# Patient Record
Sex: Male | Born: 1970 | Race: White | Hispanic: No | Marital: Single | Smoking: Never smoker
Health system: Southern US, Community
[De-identification: ages and names within clinical notes are randomized; demographics above are authoritative.]

## PROBLEM LIST (undated history)

## (undated) DIAGNOSIS — E46 Unspecified protein-calorie malnutrition: Secondary | ICD-10-CM

## (undated) DIAGNOSIS — K219 Gastro-esophageal reflux disease without esophagitis: Secondary | ICD-10-CM

## (undated) DIAGNOSIS — G809 Cerebral palsy, unspecified: Secondary | ICD-10-CM

## (undated) DIAGNOSIS — K5900 Constipation, unspecified: Secondary | ICD-10-CM

## (undated) DIAGNOSIS — I219 Acute myocardial infarction, unspecified: Secondary | ICD-10-CM

## (undated) DIAGNOSIS — M419 Scoliosis, unspecified: Secondary | ICD-10-CM

## (undated) HISTORY — PX: ABDOMINAL SURGERY: SHX537

---

## 2015-08-04 ENCOUNTER — Encounter (HOSPITAL_COMMUNITY): Payer: Self-pay | Admitting: Emergency Medicine

## 2015-08-04 ENCOUNTER — Emergency Department (INDEPENDENT_AMBULATORY_CARE_PROVIDER_SITE_OTHER)
Admission: EM | Admit: 2015-08-04 | Discharge: 2015-08-04 | Disposition: A | Discharge status: Home / Self Care | Payer: Medicare Other | Source: Home / Self Care | Attending: Family Medicine | Admitting: Family Medicine

## 2015-08-04 DIAGNOSIS — G809 Cerebral palsy, unspecified: Secondary | ICD-10-CM | POA: Diagnosis not present

## 2015-08-04 DIAGNOSIS — J069 Acute upper respiratory infection, unspecified: Secondary | ICD-10-CM

## 2015-08-04 HISTORY — DX: Scoliosis, unspecified: M41.9

## 2015-08-04 HISTORY — DX: Cerebral palsy, unspecified: G80.9

## 2015-08-04 HISTORY — DX: Unspecified protein-calorie malnutrition: E46

## 2015-08-04 HISTORY — DX: Constipation, unspecified: K59.00

## 2015-08-04 HISTORY — DX: Acute myocardial infarction, unspecified: I21.9

## 2015-08-04 HISTORY — DX: Gastro-esophageal reflux disease without esophagitis: K21.9

## 2015-08-04 MED ORDER — AZITHROMYCIN 250 MG PO TABS: 250.0000 mg | ORAL_TABLET | Freq: Every day | ORAL | Status: DC

## 2015-08-04 MED ORDER — IPRATROPIUM BROMIDE 0.06 % NA SOLN: 2.0000 | Freq: Four times a day (QID) | NASAL | Status: DC

## 2015-08-04 MED ORDER — BENZONATATE 100 MG PO CAPS: 100.0000 mg | ORAL_CAPSULE | Freq: Three times a day (TID) | ORAL | Status: DC | PRN

## 2015-08-04 NOTE — ED Notes (Signed)
C/o congestion States he has been coughing Present with guardian since May 2016 Does use neb as tx

## 2015-08-04 NOTE — Discharge Instructions (Signed)
Please start the nasal saline 2-4 times per day to clean his nasal passages Please use tessalon for cough relief Please use the nasal atrovent to dry up nasal secretions Please give him 1200mg  of guaifenesin every 12 hours to break up the mucus Please start the Azithro if his symptoms get worse.

## 2015-08-04 NOTE — ED Provider Notes (Signed)
CSN: [id number]     Arrival date & time 08/04/15  1624 History   First MD Initiated Contact with Patient 08/04/15 1645     Chief Complaint  Patient presents with  . Nasal Congestion   (Consider location/radiation/quality/duration/timing/severity/associated sxs/prior Treatment) HPI  Patient with cerebral palsy presenting with his guardian. Cough 1 day. Wet sounding. Guardian states that patient is oftentimes febrile at baseline and is on Tylenol for chronic pain management so unsure if it patient has had any breakthrough fevers. Symptoms are worse at night. He states that patient is an aspiration risk but has not appreciated any recent aspiration. Patient took a nap yesterday which is extremely unusual for him. Patient is typically will chair bound. Patient currently has a sacral decubitus ulcer for which she receives wound care management and currently is undergoing regular bandages changes with Silvadene cream. Denies any increased work of breathing, complaints of chest pain, somnolence, loss of appetite.      Past Medical History  Diagnosis Date  . Constipation   . Cerebral palsy (HCC)   . Scoliosis   . Protein calorie malnutrition (HCC)   . GERD (gastroesophageal reflux disease)   . MI (myocardial infarction) (HCC)    No past surgical history on file. Family History  Problem Relation Age of Onset  . Heart attack Mother   . Heart attack Father    Social History  Substance Use Topics  . Smoking status: None  . Smokeless tobacco: None  . Alcohol Use: None    Review of Systems Per HPI with all other pertinent systems negative.   Allergies  Review of patient's allergies indicates no known allergies.  Home Medications   Prior to Admission medications   Medication Sig Start Date End Date Taking? Authorizing Provider  acetaminophen (TYLENOL) 500 MG tablet Take 500 mg by mouth every 6 (six) hours as needed.   Yes Historical Provider, MD  albuterol (PROVENTIL) (2.5 MG/3ML)  0.083% nebulizer solution Take 2.5 mg by nebulization every 6 (six) hours as needed for wheezing or shortness of breath.   Yes Historical Provider, MD  bisacodyl (DULCOLAX) 10 MG suppository Place 10 mg rectally as needed for moderate constipation.   Yes Historical Provider, MD  ferrous sulfate 325 (65 FE) MG tablet Take 325 mg by mouth daily with breakfast.   Yes Historical Provider, MD  FLUoxetine (PROZAC) 40 MG capsule Take 40 mg by mouth daily.   Yes Historical Provider, MD  guaiFENesin (ROBITUSSIN) 100 MG/5ML liquid Take 200 mg by mouth 3 (three) times daily as needed for cough.   Yes Historical Provider, MD  lactulose, encephalopathy, (CHRONULAC) 10 GM/15ML SOLN Take by mouth.   Yes Historical Provider, MD  LORazepam (ATIVAN) 0.5 MG tablet Take 0.5 mg by mouth every 8 (eight) hours.   Yes Historical Provider, MD  magnesium oxide (MAG-OX) 400 MG tablet Take 400 mg by mouth 2 (two) times daily.   Yes Historical Provider, MD  Polyethylene Glycol 3350 GRAN by Does not apply route.   Yes Historical Provider, MD  potassium chloride (K-DUR,KLOR-CON) 10 MEQ tablet Take 10 mEq by mouth 2 (two) times daily.   Yes Historical Provider, MD  traZODone (DESYREL) 50 MG tablet Take 50 mg by mouth at bedtime.   Yes Historical Provider, MD  zinc oxide 20 % ointment Apply 1 application topically as needed for irritation.   Yes Historical Provider, MD  azithromycin (ZITHROMAX) 250 MG tablet Take 1 tablet (250 mg total) by mouth daily. Take first 2 tablets  together, then 1 every day until finished. 08/04/15   Ozella Rocksavid J Merrell, MD  benzonatate (TESSALON PERLES) 100 MG capsule Take 1-2 capsules (100-200 mg total) by mouth 3 (three) times daily as needed for cough. 08/04/15   Ozella Rocksavid J Merrell, MD  ipratropium (ATROVENT) 0.06 % nasal spray Place 2 sprays into both nostrils 4 (four) times daily. 08/04/15   Ozella Rocksavid J Merrell, MD   Meds Ordered and Administered this Visit  Medications - No data to display  BP 139/77 mmHg   Pulse 90  Temp(Src) 98.4 F (36.9 C) (Oral)  Resp 22  SpO2 96% No data found.   Physical Exam Physical Exam  Constitutional: oriented to person, place, and time. appears well-developed and well-nourished. No distress.  HENT:  Head: Normocephalic and atraumatic.  Eyes: EOMI. PERRL.  Neck: Normal range of motion.  Cardiovascular: RRR, no m/r/g, 2+ distal pulses,  Pulmonary/Chest: Large airway congestion with no crackles wheezes or rhonchi in the lung bases or apices. Normal work of breathing. Upper airway congestion relieved with coughing..  Abdominal: Soft. Bowel sounds are normal. NonTTP, no distension.  Musculoskeletal: Contracture of upper and lower extremities at baseline.  Skin: Skin is warm. No rash noted. non diaphoretic.  sacral decubitus not appreciated at this time due to difficulty with moving patient.  Psychiatric: Patient is virtually nonverbal but will shake his head in response to questions and follows basic commands.  ED Course  Procedures (including critical care time)  Labs Review Labs Reviewed - No data to display  Imaging Review No results found.   Visual Acuity Review  Right Eye Distance:   Left Eye Distance:   Bilateral Distance:    Right Eye Near:   Left Eye Near:    Bilateral Near:         MDM   1. URI (upper respiratory infection)   2. Cerebral palsy, unspecified type (HCC)    unlikely to currently have pneumonia the patient is at a very high risk for this given his recurrent pneumonias per guardian. Patient also very susceptible to treat patient conservatively with guaifenesin, Tylenol, nasal saline, nasal Atrovent. Patient will be discharged with a prescription for azithromycin to be used if he develops worsening symptoms. Family where this and will follow instructions.   Ozella Rocksavid J Merrell, MD 08/04/15 [id number]

## 2015-08-10 ENCOUNTER — Emergency Department (HOSPITAL_COMMUNITY): Payer: Medicare Other

## 2015-08-10 ENCOUNTER — Emergency Department (HOSPITAL_COMMUNITY)
Admission: EM | Admit: 2015-08-10 | Discharge: 2015-08-10 | Disposition: A | Discharge status: Home / Self Care | Payer: Medicare Other | Attending: Emergency Medicine | Admitting: Emergency Medicine

## 2015-08-10 ENCOUNTER — Encounter (HOSPITAL_COMMUNITY): Payer: Self-pay | Admitting: Emergency Medicine

## 2015-08-10 DIAGNOSIS — R05 Cough: Secondary | ICD-10-CM

## 2015-08-10 DIAGNOSIS — J40 Bronchitis, not specified as acute or chronic: Secondary | ICD-10-CM

## 2015-08-10 DIAGNOSIS — K219 Gastro-esophageal reflux disease without esophagitis: Secondary | ICD-10-CM | POA: Insufficient documentation

## 2015-08-10 DIAGNOSIS — J209 Acute bronchitis, unspecified: Secondary | ICD-10-CM | POA: Diagnosis not present

## 2015-08-10 DIAGNOSIS — R059 Cough, unspecified: Secondary | ICD-10-CM

## 2015-08-10 DIAGNOSIS — I252 Old myocardial infarction: Secondary | ICD-10-CM | POA: Insufficient documentation

## 2015-08-10 DIAGNOSIS — Z79899 Other long term (current) drug therapy: Secondary | ICD-10-CM | POA: Insufficient documentation

## 2015-08-10 LAB — BASIC METABOLIC PANEL
Anion gap: 14 (ref 5–15)
BUN: 7 mg/dL (ref 6–20)
CHLORIDE: 101 mmol/L (ref 101–111)
CO2: 25 mmol/L (ref 22–32)
CREATININE: 0.39 mg/dL — AB (ref 0.61–1.24)
Calcium: 9.9 mg/dL (ref 8.9–10.3)
GFR calc Af Amer: >60 mL/min (ref >60)
GFR calc non Af Amer: >60 mL/min (ref >60)
Glucose, Bld: 83 mg/dL (ref 65–99)
POTASSIUM: 4.7 mmol/L (ref 3.5–5.1)
Sodium: 140 mmol/L (ref 135–145)

## 2015-08-10 LAB — CBC WITH DIFFERENTIAL/PLATELET
Basophils Absolute: 0 10*3/uL (ref 0.0–0.1)
Basophils Relative: 0 %
EOS ABS: 0 10*3/uL (ref 0.0–0.7)
EOS PCT: 1 %
HCT: 41.6 % (ref 39.0–52.0)
HEMOGLOBIN: 13.3 g/dL (ref 13.0–17.0)
LYMPHS ABS: 1.2 10*3/uL (ref 0.7–4.0)
LYMPHS PCT: 15 %
MCH: 28.9 pg (ref 26.0–34.0)
MCHC: 32 g/dL (ref 30.0–36.0)
MCV: 90.4 fL (ref 78.0–100.0)
MONOS PCT: 7 %
Monocytes Absolute: 0.6 10*3/uL (ref 0.1–1.0)
Neutro Abs: 6.4 10*3/uL (ref 1.7–7.7)
Neutrophils Relative %: 77 %
PLATELETS: 280 10*3/uL (ref 150–400)
RBC: 4.6 MIL/uL (ref 4.22–5.81)
RDW: 13.5 % (ref 11.5–15.5)
WBC: 8.2 10*3/uL (ref 4.0–10.5)

## 2015-08-10 LAB — BRAIN NATRIURETIC PEPTIDE: B NATRIURETIC PEPTIDE 5: 66.4 pg/mL (ref 0.0–100.0)

## 2015-08-10 MED ORDER — ALBUTEROL SULFATE (2.5 MG/3ML) 0.083% IN NEBU
5.0000 mg | INHALATION_SOLUTION | Freq: Once | RESPIRATORY_TRACT | Status: AC
Start: 2015-08-10 — End: 2015-08-10
  Administered 2015-08-10: 5 mg via RESPIRATORY_TRACT
  Filled 2015-08-10: qty 6

## 2015-08-10 MED ORDER — BENZONATATE 100 MG PO CAPS: 100.0000 mg | ORAL_CAPSULE | Freq: Three times a day (TID) | ORAL | Status: DC

## 2015-08-10 MED ORDER — IPRATROPIUM BROMIDE 0.02 % IN SOLN
0.5000 mg | Freq: Once | RESPIRATORY_TRACT | Status: AC
  Administered 2015-08-10: 0.5 mg via RESPIRATORY_TRACT
  Filled 2015-08-10: qty 2.5

## 2015-08-10 MED ORDER — DOXYCYCLINE HYCLATE 100 MG PO CAPS: 100.0000 mg | ORAL_CAPSULE | Freq: Two times a day (BID) | ORAL | Status: DC

## 2015-08-10 NOTE — Discharge Instructions (Signed)
You have been seen today for cough and bronchitis. Your imaging and lab tests showed no abnormalities. Recommend close follow up by PCP. Return to ED should symptoms worsen.

## 2015-08-10 NOTE — ED Provider Notes (Signed)
CSN: [id number]     Arrival date & time 08/10/15  1219 History   First MD Initiated Contact with Patient 08/10/15 1431     Chief Complaint  Patient presents with  . Cough     (Consider location/radiation/quality/duration/timing/severity/associated sxs/prior Treatment) HPI   Malik Webb is a 44 y.o. male, pt with history of Cerebral Palsy and MI, presents with non-productive cough that has been going on since 10/19. Pt was given azithromycin and tessalon pearls. Pt has increased shortness of breath, cough, and congestion since then as well as orthopnea. Pt is completely non-verbal. Caregiver Raoul Pitch) denies fever for pt, but adds that he takes tylenol on a scheduled basis. Pt also has a stage three decubitus ulcer on the left side of his sacrum, about the size of a quarter. Pt accompanied today by his primary care giver, the group home supervisor. Pt can answer yes or no questions and says "no" when asked if he is hurting somewhere. Specifically denies chest pain, shortness of breath, or vomiting.     Past Medical History  Diagnosis Date  . Constipation   . Cerebral palsy (HCC)   . Scoliosis   . Protein calorie malnutrition (HCC)   . GERD (gastroesophageal reflux disease)   . MI (myocardial infarction) (HCC)    History reviewed. No pertinent past surgical history. Family History  Problem Relation Age of Onset  . Heart attack Mother   . Heart attack Father    Social History  Substance Use Topics  . Smoking status: None  . Smokeless tobacco: None  . Alcohol Use: None    Review of Systems  Constitutional: Negative for fever, diaphoresis and unexpected weight change.  HENT: Negative for rhinorrhea.   Respiratory: Positive for cough, shortness of breath and wheezing.   Cardiovascular: Positive for chest pain. Negative for palpitations and leg swelling.  Gastrointestinal: Negative for vomiting, abdominal pain, diarrhea, constipation and blood in stool.  Genitourinary: Negative  for dysuria, hematuria and flank pain.  Musculoskeletal: Negative for back pain and joint swelling.  Skin: Positive for wound. Negative for color change, pallor and rash.  Neurological: Negative for dizziness, syncope, weakness and light-headedness.  All other systems reviewed and are negative.     Allergies  Sulfa antibiotics; Cefazolin; Erythromycin; and Ofloxacin  Home Medications   Prior to Admission medications   Medication Sig Start Date End Date Taking? Authorizing Provider  acetaminophen (TYLENOL) 500 MG tablet Take 500 mg by mouth every 6 (six) hours as needed for moderate pain.    Yes Historical Provider, MD  albuterol (PROVENTIL) (2.5 MG/3ML) 0.083% nebulizer solution Take 2.5 mg by nebulization every 6 (six) hours as needed for wheezing or shortness of breath.   Yes Historical Provider, MD  baclofen (LIORESAL) 20 MG tablet Take 20 mg by mouth 3 (three) times daily.   Yes Historical Provider, MD  benzonatate (TESSALON PERLES) 100 MG capsule Take 1-2 capsules (100-200 mg total) by mouth 3 (three) times daily as needed for cough. 08/04/15  Yes Ozella Rocks, MD  ferrous sulfate 325 (65 FE) MG tablet Take 325 mg by mouth daily with breakfast.   Yes Historical Provider, MD  FLUoxetine (PROZAC) 40 MG capsule Take 40 mg by mouth daily.   Yes Historical Provider, MD  gabapentin (NEURONTIN) 300 MG capsule Take 300 mg by mouth 3 (three) times daily.   Yes Historical Provider, MD  lactulose (CHRONULAC) 10 GM/15ML solution Take 10 g by mouth daily as needed for moderate constipation.   Yes  Historical Provider, MD  LORazepam (ATIVAN) 0.5 MG tablet Take 0.5 mg by mouth at bedtime.   Yes Historical Provider, MD  magnesium oxide (MAG-OX) 400 MG tablet Take 800 mg by mouth 2 (two) times daily.    Yes Historical Provider, MD  Phenylephrine-DM-GG (QC TUSSIN CF) 5-10-100 MG/5ML LIQD Take 5 mLs by mouth every 6 (six) hours as needed (for cough).   Yes Historical Provider, MD  potassium chloride  (K-DUR,KLOR-CON) 10 MEQ tablet Take 20 mEq by mouth daily.    Yes Historical Provider, MD  sucralfate (CARAFATE) 1 G tablet Take 1 g by mouth 4 (four) times daily.   Yes Historical Provider, MD  traZODone (DESYREL) 150 MG tablet Take 150 mg by mouth at bedtime.   Yes Historical Provider, MD  traZODone (DESYREL) 50 MG tablet Take 50 mg by mouth daily as needed (for agitation).   Yes Historical Provider, MD  zinc oxide (BALMEX) 11.3 % CREA cream Apply 1 application topically 2 (two) times daily.   Yes Historical Provider, MD  benzonatate (TESSALON) 100 MG capsule Take 1 capsule (100 mg total) by mouth every 8 (eight) hours. 08/10/15   Shawn C Joy, PA-C  doxycycline (VIBRAMYCIN) 100 MG capsule Take 1 capsule (100 mg total) by mouth 2 (two) times daily. 08/10/15   Shawn C Joy, PA-C   BP 145/97 mmHg  Pulse 93  Temp(Src) 98.5 F (36.9 C)  Resp 25  SpO2 97% Physical Exam  Constitutional: He appears well-developed and well-nourished. No distress.  HENT:  Head: Normocephalic and atraumatic.  Eyes: Conjunctivae are normal. Pupils are equal, round, and reactive to light.  Cardiovascular: Normal rate, regular rhythm and normal heart sounds.   Pulmonary/Chest: Effort normal and breath sounds normal. No respiratory distress.  Abdominal: Soft. Bowel sounds are normal.  Musculoskeletal: He exhibits no edema or tenderness.  Neurological: He is alert.  Skin: Skin is warm and dry. He is not diaphoretic.  Nursing note and vitals reviewed.   ED Course  Procedures (including critical care time) Labs Review Labs Reviewed  BASIC METABOLIC PANEL - Abnormal; Notable for the following:    Creatinine, Ser 0.39 (*)    All other components within normal limits  CBC WITH DIFFERENTIAL/PLATELET  BRAIN NATRIURETIC PEPTIDE    Imaging Review Dg Chest 1 View  08/10/2015  CLINICAL DATA:  Cough, congestion in a patient with cerebral palsy. EXAM: CHEST 1 VIEW COMPARISON:  None. FINDINGS: Single frontal radiograph  demonstrates normal cardiomediastinal silhouette. The left lung is clear. There is no evidence of pleural effusion or pneumothorax. There is elevation of the right hemidiaphragm, with atelectatic changes of the right lower lobe. Gaseous distention of the colon is noted. IMPRESSION: No evidence of focal airspace consolidation. Elevation of the right hemidiaphragm with subsequent atelectatic changes in the right lung base. Gaseous distention of the colon. Electronically Signed   By: Ted Mcalpine M.D.   On: 08/10/2015 13:19   I have personally reviewed and evaluated these images and lab results as part of my medical decision-making.   EKG Interpretation   Date/Time:  Wednesday August 10 2015 15:41:57 EDT Ventricular Rate:  86 PR Interval:  167 QRS Duration: 122 QT Interval:  501 QTC Calculation: 599 R Axis:   54 Text Interpretation:  Sinus rhythm Baseline wander in lead(s) I II aVR aVL  V4 V6 No previous tracing Confirmed by Denton Lank  MD, Caryn Bee ([id number]) on  08/10/2015 5:14:32 PM      MDM   Final diagnoses:  Bronchitis  Malik Webb presents with shortness of breath and persistent cough for over 1 week.  Findings and plan of care discussed with Cathren LaineKevin Steinl, MD.  Pt has no history that would suggest a HAP, but could still have CAP, despite initial azithromycin treatment. Differential includes CAP vs viral bronchitis vs URI. Albuterol and labs ordered. Pt may also have a baseline cough. EKG shows sinus rhythm. CXR shows no consolidation or pulmonary edema. BMP, CBC, and BNP without significant abnormalities. Plan to discharge patient with refill of tessalon pearls and doxycycline 100mg  BID for 7 days. Recommend close PCP follow up. Updated caregiver, [place], with discharge plan. Sierra agrees to plan and is comfortable with discharge.     Anselm PancoastShawn C Joy, PA-C 08/10/15 1753  Cathren LaineKevin Steinl, MD 08/10/15 [phone]

## 2015-08-10 NOTE — ED Notes (Signed)
Pt here with caregiver c/o increased cough and congestion with blood tinged sputum; pt has hx of PNA in past

## 2016-01-16 ENCOUNTER — Encounter (HOSPITAL_COMMUNITY): Payer: Self-pay | Admitting: Emergency Medicine

## 2016-01-16 ENCOUNTER — Emergency Department (INDEPENDENT_AMBULATORY_CARE_PROVIDER_SITE_OTHER)
Admission: EM | Admit: 2016-01-16 | Discharge: 2016-01-16 | Disposition: A | Discharge status: Home / Self Care | Payer: Self-pay | Source: Home / Self Care

## 2016-01-16 DIAGNOSIS — M542 Cervicalgia: Secondary | ICD-10-CM

## 2016-01-16 DIAGNOSIS — M5441 Lumbago with sciatica, right side: Secondary | ICD-10-CM
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
## 2016-01-16 NOTE — ED Notes (Signed)
Transfer delayed.  Caregiver will take patient to the emergency department.  [facility] is being seen at [facility]

## 2016-01-28 ENCOUNTER — Emergency Department (HOSPITAL_COMMUNITY)
Admission: EM | Admit: 2016-01-28 | Discharge: 2016-01-28 | Disposition: A | Discharge status: Home / Self Care | Payer: Medicare Other | Attending: Emergency Medicine | Admitting: Emergency Medicine

## 2016-01-28 ENCOUNTER — Encounter (HOSPITAL_COMMUNITY): Payer: Self-pay | Admitting: Emergency Medicine

## 2016-01-28 ENCOUNTER — Emergency Department (HOSPITAL_COMMUNITY): Payer: Medicare Other

## 2016-01-28 DIAGNOSIS — Z79899 Other long term (current) drug therapy: Secondary | ICD-10-CM | POA: Insufficient documentation

## 2016-01-28 DIAGNOSIS — R059 Cough, unspecified: Secondary | ICD-10-CM

## 2016-01-28 DIAGNOSIS — R05 Cough: Secondary | ICD-10-CM | POA: Diagnosis present

## 2016-01-28 DIAGNOSIS — K219 Gastro-esophageal reflux disease without esophagitis: Secondary | ICD-10-CM | POA: Insufficient documentation

## 2016-01-28 DIAGNOSIS — R509 Fever, unspecified: Secondary | ICD-10-CM | POA: Diagnosis not present

## 2016-01-28 DIAGNOSIS — K5900 Constipation, unspecified: Secondary | ICD-10-CM | POA: Insufficient documentation

## 2016-01-28 DIAGNOSIS — I252 Old myocardial infarction: Secondary | ICD-10-CM | POA: Insufficient documentation

## 2016-01-28 DIAGNOSIS — M419 Scoliosis, unspecified: Secondary | ICD-10-CM | POA: Insufficient documentation

## 2016-01-28 DIAGNOSIS — Z8669 Personal history of other diseases of the nervous system and sense organs: Secondary | ICD-10-CM | POA: Insufficient documentation

## 2016-01-28 DIAGNOSIS — R0602 Shortness of breath: Secondary | ICD-10-CM | POA: Diagnosis not present

## 2016-01-28 DIAGNOSIS — Z8639 Personal history of other endocrine, nutritional and metabolic disease: Secondary | ICD-10-CM | POA: Insufficient documentation

## 2016-01-28 LAB — BASIC METABOLIC PANEL
ANION GAP: 10 (ref 5–15)
BUN: 9 mg/dL (ref 6–20)
CHLORIDE: 107 mmol/L (ref 101–111)
CO2: 29 mmol/L (ref 22–32)
Calcium: 9.7 mg/dL (ref 8.9–10.3)
Creatinine, Ser: 0.46 mg/dL — ABNORMAL LOW (ref 0.61–1.24)
GFR calc non Af Amer: >60 mL/min (ref >60)
Glucose, Bld: 101 mg/dL — ABNORMAL HIGH (ref 65–99)
POTASSIUM: 4 mmol/L (ref 3.5–5.1)
SODIUM: 146 mmol/L — AB (ref 135–145)

## 2016-01-28 LAB — CBC WITH DIFFERENTIAL/PLATELET
BASOS PCT: 0 %
Basophils Absolute: 0 10*3/uL (ref 0.0–0.1)
Eosinophils Absolute: 0 10*3/uL (ref 0.0–0.7)
Eosinophils Relative: 0 %
HCT: 42.1 % (ref 39.0–52.0)
Hemoglobin: 13.2 g/dL (ref 13.0–17.0)
Lymphocytes Relative: 10 %
Lymphs Abs: 0.9 10*3/uL (ref 0.7–4.0)
MCH: 29 pg (ref 26.0–34.0)
MCHC: 31.4 g/dL (ref 30.0–36.0)
MCV: 92.5 fL (ref 78.0–100.0)
MONO ABS: 0.5 10*3/uL (ref 0.1–1.0)
MONOS PCT: 5 %
NEUTROS PCT: 85 %
Neutro Abs: 7.8 10*3/uL — ABNORMAL HIGH (ref 1.7–7.7)
Platelets: 251 10*3/uL (ref 150–400)
RBC: 4.55 MIL/uL (ref 4.22–5.81)
RDW: 14 % (ref 11.5–15.5)
WBC: 9.2 10*3/uL (ref 4.0–10.5)

## 2016-01-28 LAB — URINALYSIS, ROUTINE W REFLEX MICROSCOPIC
Bilirubin Urine: NEGATIVE
Glucose, UA: NEGATIVE mg/dL
Hgb urine dipstick: NEGATIVE
Ketones, ur: NEGATIVE mg/dL
LEUKOCYTES UA: NEGATIVE
NITRITE: NEGATIVE
Protein, ur: NEGATIVE mg/dL
SPECIFIC GRAVITY, URINE: 1.02 (ref 1.005–1.030)
pH: >9 — ABNORMAL HIGH (ref 5.0–8.0)

## 2016-01-28 LAB — I-STAT CG4 LACTIC ACID, ED: LACTIC ACID, VENOUS: 1.48 mmol/L (ref 0.5–2.0)

## 2016-01-28 MED ORDER — AMOXICILLIN-POT CLAVULANATE 875-125 MG PO TABS
1.0000 | ORAL_TABLET | Freq: Once | ORAL | Status: AC
  Administered 2016-01-28: 1 via ORAL
  Filled 2016-01-28: qty 1

## 2016-01-28 MED ORDER — ACETAMINOPHEN 650 MG RE SUPP
650.0000 mg | Freq: Once | RECTAL | Status: AC
  Administered 2016-01-28: 650 mg via RECTAL
  Filled 2016-01-28: qty 1

## 2016-01-28 MED ORDER — LEVOFLOXACIN IN D5W 750 MG/150ML IV SOLN
750.0000 mg | Freq: Once | INTRAVENOUS | Status: DC
  Filled 2016-01-28: qty 150

## 2016-01-28 MED ORDER — BENZONATATE 100 MG PO CAPS: 100.0000 mg | ORAL_CAPSULE | Freq: Three times a day (TID) | ORAL | Status: DC

## 2016-01-28 MED ORDER — IPRATROPIUM-ALBUTEROL 0.5-2.5 (3) MG/3ML IN SOLN
3.0000 mL | Freq: Once | RESPIRATORY_TRACT | Status: AC
  Administered 2016-01-28: 3 mL via RESPIRATORY_TRACT
  Filled 2016-01-28: qty 3

## 2016-01-28 MED ORDER — AMOXICILLIN-POT CLAVULANATE 875-125 MG PO TABS: 1.0000 | ORAL_TABLET | Freq: Two times a day (BID) | ORAL | Status: DC

## 2016-01-28 MED ORDER — SODIUM CHLORIDE 0.9 % IV BOLUS (SEPSIS)
1000.0000 mL | Freq: Once | INTRAVENOUS | Status: AC
  Administered 2016-01-28: 1000 mL via INTRAVENOUS

## 2016-01-28 MED ORDER — ALBUTEROL SULFATE (2.5 MG/3ML) 0.083% IN NEBU: 2.5000 mg | INHALATION_SOLUTION | Freq: Four times a day (QID) | RESPIRATORY_TRACT | Status: AC | PRN

## 2016-01-28 NOTE — Discharge Instructions (Signed)
You have been seen today for cough and fever. Your imaging and lab tests showed no abnormalities, however, your symptoms may indicate an early pneumonia. Please take all of your antibiotics until finished!   You may develop abdominal discomfort or diarrhea from the antibiotic.  You may help offset this with probiotics which you can buy or get in yogurt. Do not eat or take the probiotics until 2 hours after your antibiotic. Return to the ED or go to your PCP for follow-up and repeat chest x-ray in 2 days. Return to ED sooner should symptoms worsen. Use albuterol nebulizer every 4-6 hours as needed for wheezing or shortness of breath. Talk to your PCP to have him order a suction system for you.

## 2016-01-28 NOTE — ED Notes (Signed)
Pt arrives from home with caregiver who reports fever beginning this am.  Caregiver reports general malaise since Wednesday, cough since yesterday.  Congested cough noted on arrival, sats 96% RA.

## 2016-01-28 NOTE — ED Provider Notes (Signed)
CSN: [id number]     Arrival date & time 01/28/16  4098 History   First MD Initiated Contact with Patient 01/28/16 0914     Chief Complaint  Patient presents with  . Fever  . Cough     (Consider location/radiation/quality/duration/timing/severity/associated sxs/prior Treatment) HPI   Malik Webb is a 45 y.o. male, with a history of Cerebral palsy, GERD, and MI, presenting to the ED with a cough that began April 12 with a fever that began this morning. Cough has been worsening. Pt is accompanied by his caretaker, [place]. Raoul Pitch states that the patient is unable to cough up sputum. Appetite seems to be normal. Food intake consists of pured materials. Pt was diagnosed with a stage III ulcer on his left buttock in September 2016 and has been receiving [facility] [place]. Pt has a nurse that comes by every MWF. Last visit was yesterday. Nurse told [place] that the wound looks good. The wound vac was changed yesterday as scheduled. Denies vomiting, rashes, changes in the patient's wound, or any other complaints.    Past Medical History  Diagnosis Date  . Constipation   . Cerebral palsy (HCC)   . Scoliosis   . Protein calorie malnutrition (HCC)   . GERD (gastroesophageal reflux disease)   . MI (myocardial infarction) [facility]    Past Surgical History  Procedure Laterality Date  . Abdominal surgery     Family History  Problem Relation Age of Onset  . Heart attack Mother   . Heart attack Father    Social History  Substance Use Topics  . Smoking status: Never Smoker   . Smokeless tobacco: None  . Alcohol Use: No    Review of Systems  Constitutional: Positive for fever. Negative for diaphoresis and appetite change.  Respiratory: Positive for cough and shortness of breath.   Gastrointestinal: Negative for vomiting.  All other systems reviewed and are negative.     Allergies  Sulfa antibiotics; Cefazolin; Erythromycin; and Ofloxacin  Home Medications    Prior to Admission medications   Medication Sig Start Date End Date Taking? Authorizing Provider  acetaminophen (TYLENOL) 500 MG tablet Take 500 mg by mouth every 6 (six) hours as needed for moderate pain.    Yes Historical Provider, MD  albuterol (PROVENTIL) (2.5 MG/3ML) 0.083% nebulizer solution Take 2.5 mg by nebulization every 6 (six) hours as needed for wheezing or shortness of breath.   Yes Historical Provider, MD  baclofen (LIORESAL) 20 MG tablet Take 20 mg by mouth 3 (three) times daily.   Yes Historical Provider, MD  ferrous sulfate 325 (65 FE) MG tablet Take 325 mg by mouth daily with breakfast.   Yes Historical Provider, MD  FLUoxetine (PROZAC) 40 MG capsule Take 40 mg by mouth daily.   Yes Historical Provider, MD  gabapentin (NEURONTIN) 300 MG capsule Take 300 mg by mouth 3 (three) times daily.   Yes Historical Provider, MD  lactulose (CHRONULAC) 10 GM/15ML solution Take 10 g by mouth daily as needed for moderate constipation.   Yes Historical Provider, MD  LORazepam (ATIVAN) 0.5 MG tablet Take 0.5 mg by mouth at bedtime.   Yes Historical Provider, MD  magnesium oxide (MAG-OX) 400 MG tablet Take 800 mg by mouth 2 (two) times daily.    Yes Historical Provider, MD  Phenylephrine-DM-GG (QC TUSSIN CF) 5-10-100 MG/5ML LIQD Take 5 mLs by mouth every 6 (six) hours as needed (for cough).   Yes Historical Provider, MD  potassium chloride (  K-DUR,KLOR-CON) 10 MEQ tablet Take 20 mEq by mouth daily.    Yes Historical Provider, MD  sucralfate (CARAFATE) 1 G tablet Take 1 g by mouth 4 (four) times daily.   Yes Historical Provider, MD  traZODone (DESYREL) 150 MG tablet Take 150 mg by mouth at bedtime.   Yes Historical Provider, MD  traZODone (DESYREL) 50 MG tablet Take 50 mg by mouth daily as needed (for agitation).   Yes Historical Provider, MD  albuterol (PROVENTIL) (2.5 MG/3ML) 0.083% nebulizer solution Take 3 mLs (2.5 mg total) by nebulization every 6 (six) hours as needed for wheezing or  shortness of breath. 01/28/16   Shawn C Joy, PA-C  amoxicillin-clavulanate (AUGMENTIN) 875-125 MG tablet Take 1 tablet by mouth every 12 (twelve) hours. 01/28/16   Shawn C Joy, PA-C  benzonatate (TESSALON) 100 MG capsule Take 1 capsule (100 mg total) by mouth every 8 (eight) hours. 01/28/16   Shawn C Joy, PA-C   BP 136/79 mmHg  Pulse 103  Temp(Src) 102.3 F (39.1 C) (Rectal)  Resp 28  Wt 36.288 kg  SpO2 98% Physical Exam  Constitutional: He appears well-developed and well-nourished. No distress.  HENT:  Head: Normocephalic and atraumatic.  Eyes: Conjunctivae are normal. Pupils are equal, round, and reactive to light.  Neck: Neck supple.  Cardiovascular: Normal rate, regular rhythm, normal heart sounds and intact distal pulses.   Pulmonary/Chest: Effort normal. Tachypnea noted. He has decreased breath sounds in the right lower field and the left lower field. He has rhonchi in the right lower field and the left middle field.  Patient shows increased work of breathing.  Abdominal: Soft. There is no tenderness.  Musculoskeletal: He exhibits no edema or tenderness.  Lymphadenopathy:    He has no cervical adenopathy.  Neurological: He is alert.  Skin: Skin is warm and dry. He is not diaphoretic.  Psychiatric: He has a normal mood and affect. His behavior is normal.  Nursing note and vitals reviewed.   ED Course  Procedures (including critical care time) Labs Review Labs Reviewed  CBC WITH DIFFERENTIAL/PLATELET - Abnormal; Notable for the following:    Neutro Abs 7.8 (*)    All other components within normal limits  BASIC METABOLIC PANEL - Abnormal; Notable for the following:    Sodium 146 (*)    Glucose, Bld 101 (*)    Creatinine, Ser 0.46 (*)    All other components within normal limits  URINALYSIS, ROUTINE W REFLEX MICROSCOPIC (NOT AT [facility]) - Abnormal; Notable for the following:    pH >9.0 (*)    All other components within normal limits  URINE CULTURE  I-STAT CG4 LACTIC ACID,  ED    Imaging Review Dg Chest Portable 1 View  01/28/2016  CLINICAL DATA:  Fever.  Malaise.  Cough. EXAM: PORTABLE CHEST 1 VIEW COMPARISON:  08/10/2015. FINDINGS: Poor inspiration. Normal sized heart. Clear lungs. Diffuse osteopenia. IMPRESSION: No acute abnormality. Electronically Signed   By: Beckie SaltsSteven  Reid M.D.   On: 01/28/2016 09:45   I have personally reviewed and evaluated these images and lab results as part of my medical decision-making.   EKG Interpretation None       Medications  sodium chloride 0.9 % bolus 1,000 mL (0 mLs Intravenous Stopped 01/28/16 1241)  ipratropium-albuterol (DUONEB) 0.5-2.5 (3) MG/3ML nebulizer solution 3 mL (3 mLs Nebulization Given 01/28/16 1037)  acetaminophen (TYLENOL) suppository 650 mg (650 mg Rectal Given 01/28/16 1025)  amoxicillin-clavulanate (AUGMENTIN) 875-125 MG per tablet 1 tablet (1 tablet Oral Given 01/28/16  1310)   Orders Placed This Encounter  Procedures  . Urine culture  . DG Chest Portable 1 View  . CBC with Differential  . Basic metabolic panel  . Urinalysis, Routine w reflex microscopic  . Screen for possible Sepsis (2 or more of following: HR>90, T>100.51F, T<96.61F, R>20)  . Re-check Vital Signs  . Insert peripheral IV    MDM   Final diagnoses:  Cough  Fever, unspecified fever cause    Drevin Ortner presents with cough, fever, and lethargy for the last 3 days.  Findings and plan of care discussed with Tilden Fossa, MD. Dr. Madilyn Hook personally evaluated and examined this patient.  Patient is febrile, tachycardic, tachypneic, and shows increased work of breathing. SPO2 fluctuates, but stays at an adequate level on room air. Chest x-ray shows no acute abnormalities, however, patient is presenting clinically as a pneumonia. Due to this, combined with the patient's risk for aspiration pneumonia, the patient will be treated for CAP. Upon reassessment, patient shows improvement in his work of breathing following the DuoNeb.  Patient  prescribed Augmentin, albuterol nebulizer, and Tessalon Perles. Patient given a dose of Augmentin here in the ED to observe for any adverse reaction. No adverse reaction was observed. Patient should return to the ED or go to his PCP in 2 days for reevaluation and repeat chest x-ray. Discussed home care and return precautions with the patient's caregiver. Caregiver voiced understanding of these instructions, accepts the plan, and is comfortable with discharge.   Filed Vitals:   01/28/16 0856 01/28/16 0857 01/28/16 0906 01/28/16 0907  BP:    119/98  Pulse: 107  107   Temp:    102.3 F (39.1 C)  TempSrc:    Rectal  Resp: 20  18   Weight: 36.288 kg     SpO2: 96% 95% 100%    Filed Vitals:   01/28/16 1030 01/28/16 1100 01/28/16 1145 01/28/16 1230  BP: 107/79 125/71 132/82 136/79  Pulse:  107 101 103  Temp:      TempSrc:      Resp: 21 28    Weight:      SpO2:  98% 95% 98%         Anselm Pancoast, PA-C 01/28/16 1336  Tilden Fossa, MD 01/29/16 [phone]

## 2016-01-29 LAB — URINE CULTURE: Culture: NO GROWTH

## 2016-06-09 ENCOUNTER — Emergency Department (HOSPITAL_COMMUNITY)
Admission: EM | Admit: 2016-06-09 | Discharge: 2016-06-09 | Disposition: A | Discharge status: Home / Self Care | Payer: Medicare Other | Attending: Emergency Medicine | Admitting: Emergency Medicine

## 2016-06-09 ENCOUNTER — Emergency Department (HOSPITAL_COMMUNITY): Payer: Medicare Other

## 2016-06-09 ENCOUNTER — Encounter (HOSPITAL_COMMUNITY): Payer: Self-pay | Admitting: Emergency Medicine

## 2016-06-09 DIAGNOSIS — X509XXA Other and unspecified overexertion or strenuous movements or postures, initial encounter: Secondary | ICD-10-CM | POA: Diagnosis not present

## 2016-06-09 DIAGNOSIS — I252 Old myocardial infarction: Secondary | ICD-10-CM | POA: Insufficient documentation

## 2016-06-09 DIAGNOSIS — Y939 Activity, unspecified: Secondary | ICD-10-CM | POA: Insufficient documentation

## 2016-06-09 DIAGNOSIS — Y929 Unspecified place or not applicable: Secondary | ICD-10-CM | POA: Diagnosis not present

## 2016-06-09 DIAGNOSIS — Y999 Unspecified external cause status: Secondary | ICD-10-CM | POA: Diagnosis not present

## 2016-06-09 DIAGNOSIS — S8991XA Unspecified injury of right lower leg, initial encounter: Secondary | ICD-10-CM | POA: Diagnosis present

## 2016-06-09 DIAGNOSIS — S72334A Nondisplaced oblique fracture of shaft of right femur, initial encounter for closed fracture: Secondary | ICD-10-CM | POA: Diagnosis not present

## 2016-06-09 DIAGNOSIS — R52 Pain, unspecified: Secondary | ICD-10-CM

## 2016-06-09 MED ORDER — TRAMADOL HCL 50 MG PO TABS: 50.0000 mg | ORAL_TABLET | Freq: Two times a day (BID) | ORAL | 0 refills | Status: DC | PRN

## 2016-06-09 MED ORDER — DOCUSATE SODIUM 100 MG PO CAPS: 100.0000 mg | ORAL_CAPSULE | Freq: Two times a day (BID) | ORAL | 0 refills | Status: DC

## 2016-06-09 MED ORDER — HYDROCODONE-ACETAMINOPHEN 5-325 MG PO TABS: 1.0000 | ORAL_TABLET | Freq: Four times a day (QID) | ORAL | 0 refills | Status: DC | PRN

## 2016-06-09 NOTE — ED Triage Notes (Signed)
Care giver stated, I went to bathroom last night and went out there and he was half way out of chair and any time I touch that rt. Leg he hollers.  So Im not sure if he hurt it or broke, just want to make sure.

## 2016-06-09 NOTE — ED Provider Notes (Addendum)
[facility]-EMERGENCY DEPT Provider Note   CSN: [id number] Arrival date & time: 06/09/16  1335     History   Chief Complaint No chief complaint on file.   HPI Malik Webb is a 45 y.o. male.  HPI Pt has CP and is wheelchair bound.  His caregiver had taken off his seatbelt last evening which she usually does when they are home.  She stepped away temporarily and found him on the floor.  His right leg was bent backwards.  He complained of pain but seemed to be OK.  This am he was still having pain so she brought him in to be checked.  Hx by caregiver.  Past Medical History:  Diagnosis Date  . Cerebral palsy (HCC)   . Constipation   . GERD (gastroesophageal reflux disease)   . MI (myocardial infarction) (HCC)   . Protein calorie malnutrition (HCC)   . Scoliosis     There are no active problems to display for this patient.   Past Surgical History:  Procedure Laterality Date  . ABDOMINAL SURGERY         Home Medications    Prior to Admission medications   Medication Sig Start Date End Date Taking? Authorizing Provider  acetaminophen (TYLENOL) 500 MG tablet Take 500 mg by mouth every 6 (six) hours as needed for moderate pain.     Historical Provider, MD  albuterol (PROVENTIL) (2.5 MG/3ML) 0.083% nebulizer solution Take 2.5 mg by nebulization every 6 (six) hours as needed for wheezing or shortness of breath.    Historical Provider, MD  albuterol (PROVENTIL) (2.5 MG/3ML) 0.083% nebulizer solution Take 3 mLs (2.5 mg total) by nebulization every 6 (six) hours as needed for wheezing or shortness of breath. 01/28/16   Shawn C Joy, PA-C  amoxicillin-clavulanate (AUGMENTIN) 875-125 MG tablet Take 1 tablet by mouth every 12 (twelve) hours. 01/28/16   Shawn C Joy, PA-C  baclofen (LIORESAL) 20 MG tablet Take 20 mg by mouth 3 (three) times daily.    Historical Provider, MD  benzonatate (TESSALON) 100 MG capsule Take 1 capsule (100 mg total) by mouth every 8 (eight) hours. 01/28/16   Shawn C Joy,  PA-C  docusate sodium (COLACE) 100 MG capsule Take 1 capsule (100 mg total) by mouth 2 (two) times daily. 06/09/16   Linwood DibblesJon Shakayla Hickox, MD  ferrous sulfate 325 (65 FE) MG tablet Take 325 mg by mouth daily with breakfast.    Historical Provider, MD  FLUoxetine (PROZAC) 40 MG capsule Take 40 mg by mouth daily.    Historical Provider, MD  gabapentin (NEURONTIN) 300 MG capsule Take 300 mg by mouth 3 (three) times daily.    Historical Provider, MD  HYDROcodone-acetaminophen (NORCO/VICODIN) 5-325 MG tablet Take 1 tablet by mouth every 6 (six) hours as needed. 06/09/16   Linwood DibblesJon Kinzleigh Kandler, MD  lactulose (CHRONULAC) 10 GM/15ML solution Take 10 g by mouth daily as needed for moderate constipation.    Historical Provider, MD  LORazepam (ATIVAN) 0.5 MG tablet Take 0.5 mg by mouth at bedtime.    Historical Provider, MD  magnesium oxide (MAG-OX) 400 MG tablet Take 800 mg by mouth 2 (two) times daily.     Historical Provider, MD  Phenylephrine-DM-GG (QC TUSSIN CF) 5-10-100 MG/5ML LIQD Take 5 mLs by mouth every 6 (six) hours as needed (for cough).    Historical Provider, MD  potassium chloride (K-DUR,KLOR-CON) 10 MEQ tablet Take 20 mEq by mouth daily.     Historical Provider, MD  sucralfate (CARAFATE) 1 G tablet Take  1 g by mouth 4 (four) times daily.    Historical Provider, MD  traZODone (DESYREL) 150 MG tablet Take 150 mg by mouth at bedtime.    Historical Provider, MD  traZODone (DESYREL) 50 MG tablet Take 50 mg by mouth daily as needed (for agitation).    Historical Provider, MD    Family History Family History  Problem Relation Age of Onset  . Heart attack Mother   . Heart attack Father     Social History Social History  Substance Use Topics  . Smoking status: Never Smoker  . Smokeless tobacco: Never Used  . Alcohol use No     Allergies   Sulfa antibiotics; Cefazolin; Erythromycin; and Ofloxacin   Review of Systems Review of Systems  Constitutional: Negative for fever.  Gastrointestinal: Negative for  abdominal pain and vomiting.  All other systems reviewed and are negative.    Physical Exam Updated Vital Signs BP 132/80   Pulse 111   Temp 98.9 F (37.2 C) (Oral)   Resp 18   Wt 40.8 kg   SpO2 (!) 79%   Physical Exam  Constitutional: He appears well-developed and well-nourished. No distress.  HENT:  Head: Normocephalic and atraumatic.  Right Ear: External ear normal.  Left Ear: External ear normal.  Eyes: Conjunctivae are normal. Right eye exhibits no discharge. Left eye exhibits no discharge. No scleral icterus.  Neck: Neck supple. No tracheal deviation present.  Cardiovascular: Normal rate.   Pulmonary/Chest: Effort normal. No stridor. No respiratory distress.  Abdominal: He exhibits no distension.  Musculoskeletal: He exhibits no edema.       Right upper leg: He exhibits tenderness and bony tenderness.  Neurological: He is alert. He displays atrophy. Cranial nerve deficit: no gross deficits. Coordination abnormal.  Nonverbal, spasticity  Skin: Skin is warm and dry. No rash noted.  Psychiatric: He has a normal mood and affect.  Nursing note and vitals reviewed.    ED Treatments / Results  Labs (all labs ordered are listed, but only abnormal results are displayed) Labs Reviewed - No data to display  EKG  EKG Interpretation None       Radiology Dg Pelvis 1-2 Views  Result Date: 06/09/2016 CLINICAL DATA:  Cerebral Palsy patient who slipped out of his wheelchair last night when caregiver removed seatbelt briefly; Right leg appears painful; Pt is spastic and positioning difficult; Best images obtained EXAM: PELVIS - 1-2 VIEW COMPARISON:  None. FINDINGS: No pelvic fracture or sacral fracture. Osteopenia noted. No femoral neck fracture identified. Large stool ball rectum. IMPRESSION: 1. No fracture. 2. Osteopenia. 3. Large stool ball. Electronically Signed   By: Genevive Bi M.D.   On: 06/09/2016 15:41   Dg Femur, Min 2 Views Right  Result Date:  06/09/2016 CLINICAL DATA:  Fall.  History of cerebral palsy. EXAM: RIGHT FEMUR 2 VIEWS COMPARISON:  None. FINDINGS: The bones appear osteopenic. No dislocations identified. Nondisplaced, oblique fracture involves the distal shaft of the femur is identified. IMPRESSION: 1. Osteopenia. 2. Acute fracture involves the distal shaft of the right femur. Electronically Signed   By: Signa Kell M.D.   On: 06/09/2016 15:41    Procedures Procedures (including critical care time)  Medications Ordered in ED Medications - No data to display   Initial Impression / Assessment and Plan / ED Course  I have reviewed the triage vital signs and the nursing notes.  Pertinent labs & imaging results that were available during my care of the patient were reviewed by me  and considered in my medical decision making (see chart for details).  Clinical Course  Value Comment By Time   Discussed with Dr Roda Shutters.  No need for surgery or splint Linwood Dibbles, MD 08/26 1901  DG FEMUR, MIN 2 VIEWS RIGHT (Reviewed) Linwood Dibbles, MD 08/26 1904   Discussed findings and plan with patient, caregiver and sister.  Will dc home with hydrocodone instead of ultram because of possible med interaction  Final Clinical Impressions(s) / ED Diagnoses   Final diagnoses:  Closed nondisplaced oblique fracture of shaft of right femur, initial encounter (HCC)    New Prescriptions New Prescriptions   DOCUSATE SODIUM (COLACE) 100 MG CAPSULE    Take 1 capsule (100 mg total) by mouth 2 (two) times daily.   HYDROCODONE-ACETAMINOPHEN (NORCO/VICODIN) 5-325 MG TABLET    Take 1 tablet by mouth every 6 (six) hours as needed.     Linwood Dibbles, MD 06/09/16 1921  Late entry PE    Linwood Dibbles, MD 08/02/16 2222

## 2016-07-19 ENCOUNTER — Ambulatory Visit (INDEPENDENT_AMBULATORY_CARE_PROVIDER_SITE_OTHER): Payer: Medicare Other | Admitting: Orthopaedic Surgery

## 2016-07-19 DIAGNOSIS — S72391D Other fracture of shaft of right femur, subsequent encounter for closed fracture with routine healing: Secondary | ICD-10-CM | POA: Diagnosis not present

## 2016-08-16 ENCOUNTER — Ambulatory Visit (INDEPENDENT_AMBULATORY_CARE_PROVIDER_SITE_OTHER): Payer: Medicare Other

## 2016-08-16 ENCOUNTER — Ambulatory Visit (INDEPENDENT_AMBULATORY_CARE_PROVIDER_SITE_OTHER): Payer: Medicare Other | Admitting: Orthopaedic Surgery

## 2016-08-16 DIAGNOSIS — S72455D Nondisplaced supracondylar fracture without intracondylar extension of lower end of left femur, subsequent encounter for closed fracture with routine healing: Secondary | ICD-10-CM | POA: Diagnosis not present

## 2016-08-16 NOTE — Progress Notes (Signed)
   Office Visit Note   Patient: Malik Webb           Date of Birth: 06-12-1971           MRN: [id number] Visit Date: 08/16/2016              Requested by: Franciso BendMichael B Shipley, MD [address] [place], [place] [address] PCP: Franciso BendSHIPLEY, MICHAEL B, MD   Assessment & Plan: Visit Diagnoses:  1. Closed nondisplaced supracondylar fracture of lower end of left femur without intracondylar extension with routine healing     Plan:  - xrays show abundant callus formation, alignment acceptable - f/u 6 weeks with repeat femur xrays  Follow-Up Instructions: Return in about 6 weeks (around 09/27/2016) for recheck right femur.   Orders:  Orders Placed This Encounter  Procedures  . XR Knee 1-2 Views Right   No orders of the defined types were placed in this encounter.     Procedures: No procedures performed   Clinical Data: No additional findings.   Subjective: Chief Complaint  Patient presents with  . Right Knee - Pain, Fracture, Follow-up    RIGHT FEMUR    8 week f/u for femur fx.  Doing much better.  In less pain.    Review of Systems   Objective: Vital Signs: There were no vitals taken for this visit.  Physical Exam  Right Knee Exam  Right knee exam is normal.      Specialty Comments:  No specialty comments available.  Imaging: No results found.   PMFS History: There are no active problems to display for this patient.  Past Medical History:  Diagnosis Date  . Cerebral palsy (HCC)   . Constipation   . GERD (gastroesophageal reflux disease)   . MI (myocardial infarction)   . Protein calorie malnutrition (HCC)   . Scoliosis     Family History  Problem Relation Age of Onset  . Heart attack Mother   . Heart attack Father     Past Surgical History:  Procedure Laterality Date  . ABDOMINAL SURGERY     Social History   Occupational History  . Not on file.   Social History Main Topics  . Smoking status: Never Smoker  . Smokeless tobacco:  Never Used  . Alcohol use No  . Drug use: No  . Sexual activity: Not on file

## 2016-09-28 ENCOUNTER — Ambulatory Visit (INDEPENDENT_AMBULATORY_CARE_PROVIDER_SITE_OTHER): Payer: Medicare Other | Admitting: Orthopaedic Surgery

## 2016-11-02 ENCOUNTER — Ambulatory Visit (INDEPENDENT_AMBULATORY_CARE_PROVIDER_SITE_OTHER): Payer: Medicare Other

## 2016-11-02 ENCOUNTER — Ambulatory Visit (INDEPENDENT_AMBULATORY_CARE_PROVIDER_SITE_OTHER): Payer: Medicare Other | Admitting: Orthopaedic Surgery

## 2016-11-02 DIAGNOSIS — M79604 Pain in right leg: Secondary | ICD-10-CM

## 2016-11-02 DIAGNOSIS — M898X5 Other specified disorders of bone, thigh: Secondary | ICD-10-CM | POA: Diagnosis not present

## 2016-11-02 DIAGNOSIS — M79605 Pain in left leg: Secondary | ICD-10-CM | POA: Diagnosis not present

## 2016-11-02 MED ORDER — HYDROCODONE-ACETAMINOPHEN 7.5-325 MG/15ML PO SOLN: 5.0000 mL | Freq: Four times a day (QID) | ORAL | 0 refills | Status: DC | PRN

## 2016-11-02 NOTE — Progress Notes (Signed)
   Office Visit Note   Patient: Malik Webb           Date of Birth: 06-07-1971           MRN: [id number] Visit Date: 11/02/2016              Requested by: Franciso BendMichael B Shipley, MD [address] [place], [place] [address] PCP: Franciso BendSHIPLEY, MICHAEL B, MD   Assessment & Plan: Visit Diagnoses:  1. Pain in right femur   2. Pain of right femur   3. Bilateral leg pain     Plan: Judging by the x-rays I think he has refractured his supracondylar femur fracture that he previously healed. I refilled his Lortab. Continue with nonoperative treatment. Brace is not appropriate for his contractures in my opinion. I'll see him back in 4 weeks with repeat 2 view x-rays of the right knee  Follow-Up Instructions: Return in about 4 weeks (around 11/30/2016).   Orders:  Orders Placed This Encounter  Procedures  . XR FEMUR, MIN 2 VIEWS RIGHT  . XR FEMUR MIN 2 VIEWS LEFT   Meds ordered this encounter  Medications  . HYDROcodone-acetaminophen (HYCET) 7.5-325 mg/15 ml solution    Sig: Take 5-10 mLs by mouth 4 (four) times daily as needed for moderate pain.    Dispense:  500 mL    Refill:  0      Procedures: No procedures performed   Clinical Data: No additional findings.   Subjective: No chief complaint on file.   Patient is a 46 year old wheelchair-bound cerebral palsy gentleman who comes in with worsening pain of his right leg. I previously treated him for subcondylar femur fracture. His caretaker knows that he is demonstrating a lot of pain. She wants to get him checked out.    Review of Systems Complete review of systems negative except for history of present illness  Objective: Vital Signs: There were no vitals taken for this visit.  Physical Exam  Ortho Exam Exam of the right lower extremity shows mainly pain and wincing when palpating around the supracondylar region. Movement of the knee also causes pain. Hip and femur and tibia are nontender. Specialty Comments:  No specialty  comments available.  Imaging: Xr Femur Min 2 Views Left  Result Date: 11/02/2016 No acute findings. General osteopenia  Xr Femur, Min 2 Views Right  Result Date: 11/02/2016 Acute on subacute supracondylar femur fracture    PMFS History: There are no active problems to display for this patient.  Past Medical History:  Diagnosis Date  . Cerebral palsy (HCC)   . Constipation   . GERD (gastroesophageal reflux disease)   . MI (myocardial infarction)   . Protein calorie malnutrition (HCC)   . Scoliosis     Family History  Problem Relation Age of Onset  . Heart attack Mother   . Heart attack Father     Past Surgical History:  Procedure Laterality Date  . ABDOMINAL SURGERY     Social History   Occupational History  . Not on file.   Social History Main Topics  . Smoking status: Never Smoker  . Smokeless tobacco: Never Used  . Alcohol use No  . Drug use: No  . Sexual activity: Not on file

## 2016-12-02 DIAGNOSIS — S72455D Nondisplaced supracondylar fracture without intracondylar extension of lower end of left femur, subsequent encounter for closed fracture with routine healing: Secondary | ICD-10-CM | POA: Insufficient documentation

## 2016-12-03 ENCOUNTER — Ambulatory Visit (INDEPENDENT_AMBULATORY_CARE_PROVIDER_SITE_OTHER): Payer: Medicare Other | Admitting: Orthopaedic Surgery

## 2017-03-16 ENCOUNTER — Encounter (HOSPITAL_COMMUNITY): Payer: Self-pay | Admitting: Emergency Medicine

## 2017-03-16 ENCOUNTER — Emergency Department (HOSPITAL_COMMUNITY): Payer: Medicare Other

## 2017-03-16 ENCOUNTER — Inpatient Hospital Stay (HOSPITAL_COMMUNITY)
Admission: EM | Admit: 2017-03-16 | Discharge: 2017-03-21 | DRG: 177 | Payer: Medicare Other | Attending: Internal Medicine | Admitting: Internal Medicine

## 2017-03-16 DIAGNOSIS — Z515 Encounter for palliative care: Secondary | ICD-10-CM

## 2017-03-16 DIAGNOSIS — I5022 Chronic systolic (congestive) heart failure: Secondary | ICD-10-CM | POA: Diagnosis present

## 2017-03-16 DIAGNOSIS — T17908A Unspecified foreign body in respiratory tract, part unspecified causing other injury, initial encounter: Secondary | ICD-10-CM

## 2017-03-16 DIAGNOSIS — G800 Spastic quadriplegic cerebral palsy: Secondary | ICD-10-CM | POA: Diagnosis present

## 2017-03-16 DIAGNOSIS — G809 Cerebral palsy, unspecified: Secondary | ICD-10-CM | POA: Diagnosis present

## 2017-03-16 DIAGNOSIS — R0902 Hypoxemia: Secondary | ICD-10-CM

## 2017-03-16 DIAGNOSIS — K219 Gastro-esophageal reflux disease without esophagitis: Secondary | ICD-10-CM | POA: Diagnosis present

## 2017-03-16 DIAGNOSIS — M419 Scoliosis, unspecified: Secondary | ICD-10-CM | POA: Diagnosis present

## 2017-03-16 DIAGNOSIS — J690 Pneumonitis due to inhalation of food and vomit: Secondary | ICD-10-CM | POA: Diagnosis not present

## 2017-03-16 DIAGNOSIS — I252 Old myocardial infarction: Secondary | ICD-10-CM

## 2017-03-16 DIAGNOSIS — J181 Lobar pneumonia, unspecified organism: Secondary | ICD-10-CM

## 2017-03-16 DIAGNOSIS — Z993 Dependence on wheelchair: Secondary | ICD-10-CM

## 2017-03-16 DIAGNOSIS — J189 Pneumonia, unspecified organism: Secondary | ICD-10-CM | POA: Diagnosis present

## 2017-03-16 DIAGNOSIS — E46 Unspecified protein-calorie malnutrition: Secondary | ICD-10-CM | POA: Diagnosis present

## 2017-03-16 DIAGNOSIS — Z66 Do not resuscitate: Secondary | ICD-10-CM | POA: Diagnosis present

## 2017-03-16 DIAGNOSIS — L89152 Pressure ulcer of sacral region, stage 2: Secondary | ICD-10-CM | POA: Diagnosis present

## 2017-03-16 DIAGNOSIS — Z881 Allergy status to other antibiotic agents status: Secondary | ICD-10-CM

## 2017-03-16 DIAGNOSIS — Z882 Allergy status to sulfonamides status: Secondary | ICD-10-CM

## 2017-03-16 DIAGNOSIS — R1310 Dysphagia, unspecified: Secondary | ICD-10-CM | POA: Diagnosis present

## 2017-03-16 DIAGNOSIS — L8990 Pressure ulcer of unspecified site, unspecified stage: Secondary | ICD-10-CM | POA: Insufficient documentation

## 2017-03-16 DIAGNOSIS — Z79899 Other long term (current) drug therapy: Secondary | ICD-10-CM

## 2017-03-16 DIAGNOSIS — K2270 Barrett's esophagus without dysplasia: Secondary | ICD-10-CM | POA: Diagnosis present

## 2017-03-16 LAB — COMPREHENSIVE METABOLIC PANEL
ALK PHOS: 100 U/L (ref 38–126)
ALT: 28 U/L (ref 17–63)
ANION GAP: 7 (ref 5–15)
AST: 27 U/L (ref 15–41)
Albumin: 3.2 g/dL — ABNORMAL LOW (ref 3.5–5.0)
BUN: 11 mg/dL (ref 6–20)
CALCIUM: 9.1 mg/dL (ref 8.9–10.3)
CHLORIDE: 107 mmol/L (ref 101–111)
CO2: 25 mmol/L (ref 22–32)
CREATININE: 0.35 mg/dL — AB (ref 0.61–1.24)
Glucose, Bld: 107 mg/dL — ABNORMAL HIGH (ref 65–99)
Potassium: 3.3 mmol/L — ABNORMAL LOW (ref 3.5–5.1)
Sodium: 139 mmol/L (ref 135–145)
Total Bilirubin: 0.6 mg/dL (ref 0.3–1.2)
Total Protein: 6.1 g/dL — ABNORMAL LOW (ref 6.5–8.1)

## 2017-03-16 LAB — CBC WITH DIFFERENTIAL/PLATELET
BASOS ABS: 0 10*3/uL (ref 0.0–0.1)
Basophils Relative: 0 %
EOS ABS: 0 10*3/uL (ref 0.0–0.7)
EOS PCT: 0 %
HCT: 40.9 % (ref 39.0–52.0)
Hemoglobin: 13.1 g/dL (ref 13.0–17.0)
Lymphocytes Relative: 10 %
Lymphs Abs: 0.6 10*3/uL — ABNORMAL LOW (ref 0.7–4.0)
MCH: 30.5 pg (ref 26.0–34.0)
MCHC: 32 g/dL (ref 30.0–36.0)
MCV: 95.1 fL (ref 78.0–100.0)
Monocytes Absolute: 0.4 10*3/uL (ref 0.1–1.0)
Monocytes Relative: 6 %
Neutro Abs: 5.6 10*3/uL (ref 1.7–7.7)
Neutrophils Relative %: 84 %
PLATELETS: 201 10*3/uL (ref 150–400)
RBC: 4.3 MIL/uL (ref 4.22–5.81)
RDW: 13.9 % (ref 11.5–15.5)
WBC: 6.7 10*3/uL (ref 4.0–10.5)

## 2017-03-16 LAB — I-STAT TROPONIN, ED: TROPONIN I, POC: 0 ng/mL (ref 0.00–0.08)

## 2017-03-16 LAB — BRAIN NATRIURETIC PEPTIDE: B NATRIURETIC PEPTIDE 5: 64.5 pg/mL (ref 0.0–100.0)

## 2017-03-16 MED ORDER — FLUOXETINE HCL 20 MG PO CAPS
40.0000 mg | ORAL_CAPSULE | Freq: Every day | ORAL | Status: DC
  Administered 2017-03-17 – 2017-03-18 (×2): 40 mg via ORAL
  Filled 2017-03-16 (×3): qty 2

## 2017-03-16 MED ORDER — LORAZEPAM 0.5 MG PO TABS: 0.5000 mg | ORAL_TABLET | Freq: Every evening | ORAL | Status: DC | PRN

## 2017-03-16 MED ORDER — LACTULOSE 10 GM/15ML PO SOLN: 20.0000 g | Freq: Four times a day (QID) | ORAL | Status: DC | PRN

## 2017-03-16 MED ORDER — SODIUM CHLORIDE 0.9 % IV SOLN
INTRAVENOUS | Status: DC
  Administered 2017-03-16 – 2017-03-17 (×2): via INTRAVENOUS

## 2017-03-16 MED ORDER — ENOXAPARIN SODIUM 40 MG/0.4ML ~~LOC~~ SOLN
40.0000 mg | SUBCUTANEOUS | Status: DC
  Administered 2017-03-17: 40 mg via SUBCUTANEOUS
  Filled 2017-03-16: qty 0.4

## 2017-03-16 MED ORDER — ACETAMINOPHEN 500 MG PO TABS
500.0000 mg | ORAL_TABLET | Freq: Four times a day (QID) | ORAL | Status: DC | PRN
  Administered 2017-03-17 – 2017-03-18 (×4): 500 mg via ORAL
  Filled 2017-03-16 (×4): qty 1

## 2017-03-16 MED ORDER — PANTOPRAZOLE SODIUM 40 MG IV SOLR
40.0000 mg | Freq: Two times a day (BID) | INTRAVENOUS | Status: DC
  Administered 2017-03-16 – 2017-03-18 (×5): 40 mg via INTRAVENOUS
  Filled 2017-03-16 (×7): qty 40

## 2017-03-16 MED ORDER — BISACODYL 10 MG RE SUPP: 10.0000 mg | Freq: Every day | RECTAL | Status: DC | PRN

## 2017-03-16 MED ORDER — BACLOFEN 20 MG PO TABS
20.0000 mg | ORAL_TABLET | Freq: Three times a day (TID) | ORAL | Status: DC
  Administered 2017-03-16 – 2017-03-19 (×8): 20 mg via ORAL
  Filled 2017-03-16 (×11): qty 1

## 2017-03-16 MED ORDER — VANCOMYCIN HCL IN DEXTROSE 1-5 GM/200ML-% IV SOLN
1000.0000 mg | Freq: Once | INTRAVENOUS | Status: AC
  Administered 2017-03-16: 1000 mg via INTRAVENOUS
  Filled 2017-03-16: qty 200

## 2017-03-16 MED ORDER — PANTOPRAZOLE SODIUM 40 MG PO TBEC: 80.0000 mg | DELAYED_RELEASE_TABLET | Freq: Two times a day (BID) | ORAL | Status: DC

## 2017-03-16 MED ORDER — SUCRALFATE 1 G PO TABS
1.0000 g | ORAL_TABLET | Freq: Three times a day (TID) | ORAL | Status: DC
  Administered 2017-03-16 – 2017-03-18 (×7): 1 g via ORAL
  Filled 2017-03-16 (×8): qty 1

## 2017-03-16 MED ORDER — FERROUS SULFATE 325 (65 FE) MG PO TABS
325.0000 mg | ORAL_TABLET | Freq: Every morning | ORAL | Status: DC
  Administered 2017-03-17 – 2017-03-18 (×2): 325 mg via ORAL
  Filled 2017-03-16 (×3): qty 1

## 2017-03-16 MED ORDER — ALBUTEROL SULFATE (2.5 MG/3ML) 0.083% IN NEBU
2.5000 mg | INHALATION_SOLUTION | Freq: Four times a day (QID) | RESPIRATORY_TRACT | Status: DC | PRN
  Administered 2017-03-17 – 2017-03-18 (×4): 2.5 mg via RESPIRATORY_TRACT
  Filled 2017-03-16 (×4): qty 3

## 2017-03-16 MED ORDER — GUAIFENESIN 100 MG/5ML PO SOLN: 100.0000 mg | ORAL | Status: DC | PRN

## 2017-03-16 MED ORDER — ZINC OXIDE 20 % EX OINT
1.0000 "application " | TOPICAL_OINTMENT | Freq: Two times a day (BID) | CUTANEOUS | Status: DC
  Filled 2017-03-16: qty 28.35

## 2017-03-16 MED ORDER — POLYETHYLENE GLYCOL 3350 17 G PO PACK: 17.0000 g | PACK | Freq: Every day | ORAL | Status: DC | PRN

## 2017-03-16 MED ORDER — TRAZODONE HCL 50 MG PO TABS: 25.0000 mg | ORAL_TABLET | Freq: Every day | ORAL | Status: DC | PRN

## 2017-03-16 MED ORDER — IPRATROPIUM-ALBUTEROL 0.5-2.5 (3) MG/3ML IN SOLN
3.0000 mL | Freq: Once | RESPIRATORY_TRACT | Status: AC
  Administered 2017-03-16: 3 mL via RESPIRATORY_TRACT
  Filled 2017-03-16: qty 30

## 2017-03-16 MED ORDER — TRAZODONE HCL 50 MG PO TABS
150.0000 mg | ORAL_TABLET | Freq: Every day | ORAL | Status: DC
  Administered 2017-03-16 – 2017-03-18 (×3): 150 mg via ORAL
  Filled 2017-03-16 (×4): qty 1

## 2017-03-16 MED ORDER — LORAZEPAM 2 MG/ML IJ SOLN
0.5000 mg | Freq: Every evening | INTRAMUSCULAR | Status: DC | PRN
  Administered 2017-03-16 – 2017-03-17 (×2): 0.5 mg via INTRAVENOUS
  Filled 2017-03-16 (×2): qty 1

## 2017-03-16 MED ORDER — MAGNESIUM OXIDE 400 (241.3 MG) MG PO TABS
800.0000 mg | ORAL_TABLET | Freq: Two times a day (BID) | ORAL | Status: DC
  Administered 2017-03-16 – 2017-03-18 (×5): 800 mg via ORAL
  Filled 2017-03-16 (×6): qty 2

## 2017-03-16 MED ORDER — GABAPENTIN 300 MG PO CAPS
300.0000 mg | ORAL_CAPSULE | Freq: Three times a day (TID) | ORAL | Status: DC
  Administered 2017-03-16 – 2017-03-19 (×8): 300 mg via ORAL
  Filled 2017-03-16 (×10): qty 1

## 2017-03-16 MED ORDER — BENZONATATE 100 MG PO CAPS: 100.0000 mg | ORAL_CAPSULE | Freq: Two times a day (BID) | ORAL | Status: DC | PRN

## 2017-03-16 MED ORDER — POTASSIUM CHLORIDE CRYS ER 20 MEQ PO TBCR
20.0000 meq | EXTENDED_RELEASE_TABLET | Freq: Every day | ORAL | Status: DC
  Administered 2017-03-17 – 2017-03-18 (×2): 20 meq via ORAL
  Filled 2017-03-16 (×3): qty 1

## 2017-03-16 MED ORDER — DEXTROSE 5 % IV SOLN
2.0000 g | Freq: Once | INTRAVENOUS | Status: AC
  Administered 2017-03-16: 2 g via INTRAVENOUS
  Filled 2017-03-16: qty 2

## 2017-03-16 NOTE — ED Notes (Signed)
Patient transported to X-ray 

## 2017-03-16 NOTE — ED Triage Notes (Signed)
To ed with care provider-- pt has cerebral palsy-- is in a special w/c at all times, pt has cold sx and shortness of breath-- wet/gurgling sounding resp, caregiver [facility]

## 2017-03-16 NOTE — H&P (Signed)
History and Physical    Malik BrilliantJohn Webb XBJ:[id number] DOB: Jun 30, 1971 DOA: 03/16/2017  PCP: Franciso BendShipley, Michael B, MD  Patient coming from: Group home / lives with guardian  I have personally briefly reviewed patient's old medical records [facility]  Chief Complaint: Cough  HPI: Malik BrilliantJohn Webb is a 46 y.o. male with medical history significant of CP, lives in group home with guardian.  Patient presents to the ED after 2-3 day history of worsening cough.  Cough is wet sounding.  Associated SOB and CP per caregiver.  Caregiver had some left over amoxicillin at house and she started giving him this med last evening and this morning.  No fever nor chills, no vomiting, no diarrhea.   ED Course: RUL PNA on CXR,    Review of Systems: Unable to perform due to limited patient verbal activity.  Past Medical History:  Diagnosis Date  . Cerebral palsy (HCC)   . Constipation   . GERD (gastroesophageal reflux disease)   . MI (myocardial infarction) (HCC)   . Protein calorie malnutrition (HCC)   . Scoliosis     Past Surgical History:  Procedure Laterality Date  . ABDOMINAL SURGERY       reports that he has never smoked. He has never used smokeless tobacco. He reports that he does not drink alcohol or use drugs.  Allergies  Allergen Reactions  . Sulfa Antibiotics Diarrhea    (Already present in Epic, but no allergies/intolerances were noted on MAR)  . Cefazolin Rash    (Already present in Epic, but no allergies were noted on MAR)  . Erythromycin Rash    (Already present in Epic, but no allergies were noted on MAR)  . Ofloxacin Rash    Possible rash, but can tolerate Avelox (Already present in Epic, but no allergies were noted on MAR)    Family History  Problem Relation Age of Onset  . Heart attack Mother   . Heart attack Father      Prior to Admission medications   Medication Sig Start Date End Date Taking? Authorizing Provider  acetaminophen (TYLENOL) 500 MG tablet Take 500  mg by mouth every 6 (six) hours as needed (for pain).    Yes [provider]  albuterol (PROVENTIL) (2.5 MG/3ML) 0.083% nebulizer solution Take 3 mLs (2.5 mg total) by nebulization every 6 (six) hours as needed for wheezing or shortness of breath. Patient taking differently: Take 2.5 mg by nebulization every 4 (four) hours as needed (for coughing).  01/28/16  Yes Joy, Shawn C, PA-C  baclofen (LIORESAL) 20 MG tablet Take 20 mg by mouth 3 (three) times daily.   Yes [provider]  benzonatate (TESSALON) 100 MG capsule Take 100 mg by mouth up to two times a day as needed for cough 06/06/16  Yes [provider]  bisacodyl (DULCOLAX) 10 MG suppository Place 10 mg rectally daily as needed (for constipation/if no BM in 2 days).   Yes [provider]  esomeprazole (NEXIUM) 40 MG capsule Take 40 mg by mouth 2 (two) times daily.   Yes [provider]  ferrous sulfate 325 (65 FE) MG tablet Take 325 mg by mouth every morning.    Yes [provider]  FLUoxetine (PROZAC) 40 MG capsule Take 40 mg by mouth in the morning 02/23/16  Yes [provider]  gabapentin (NEURONTIN) 300 MG capsule Take 300 mg by mouth 3 (three) times daily.   Yes [provider]  guaiFENesin (HM TUSSIN ADULT) 100  MG/5ML liquid Take 100 mg by mouth every 4 (four) hours as needed for cough.   Yes [provider]  lactulose (CONSTULOSE) 10 GM/15ML solution Take 20 g by mouth 4 (four) times daily as needed for mild constipation.   Yes [provider]  LORazepam (ATIVAN) 0.5 MG tablet Take 0.5 mg by mouth at bedtime as needed for anxiety.    Yes [provider]  magnesium oxide (MAG-OX) 400 MG tablet Take 800 mg by mouth 2 (two) times daily.   Yes [provider]  polyethylene glycol (MIRALAX / GLYCOLAX) packet Take 17 g by mouth daily as needed for mild constipation. MIX INTO 4-8 OUNCES OF WATER AND DRINK   Yes [provider]    potassium chloride (K-DUR,KLOR-CON) 10 MEQ tablet Take 20 mEq by mouth daily.    Yes [provider]  sucralfate (CARAFATE) 1 g tablet Take 1 g by mouth 4 (four) times daily -  before meals and at bedtime.  02/27/16  Yes [provider]  traZODone (DESYREL) 150 MG tablet Take 150 mg by mouth at bedtime.   Yes [provider]  traZODone (DESYREL) 50 MG tablet Take 25-50 mg by mouth See admin instructions. WITH LUNCH AS NEEDED FOR AGITATION   Yes [provider]  zinc oxide 20 % ointment Apply 1 application topically 2 (two) times daily. TO AFFECTED AREA   Yes [provider]    Physical Exam: Vitals:   03/16/17 1845 03/16/17 1900 03/16/17 1945 03/16/17 1947  BP: (!) 134/96 103/74    Pulse: (!) 105 99    Resp: (!) 30  (!) 26 (!) 25  Temp:      TempSrc:      SpO2: 92% 98%      Constitutional: NAD, calm, comfortable Eyes: PERRL, lids and conjunctivae normal ENMT: Mucous membranes are moist. Posterior pharynx clear of any exudate or lesions.Normal dentition.  Neck: normal, supple, no masses, no thyromegaly Respiratory: Coarse breath sounds with wet sounding cough. Cardiovascular: Regular rate and rhythm, no murmurs / rubs / gallops. No extremity edema. 2+ pedal pulses. No carotid bruits.  Abdomen: no tenderness, no masses palpated. No hepatosplenomegaly. Bowel sounds positive.  Musculoskeletal: Flexion contractures of BUE with wasting.  Flaccid paralysis of BLE with wasting Skin: Has decubitus Neurologic: Flaccid paralysis of BLE with wasting, flexion contractures of BUE with wasting Psychiatric: Unable to assess speech due to baseline deficits   Labs on Admission: I have personally reviewed following labs and imaging studies  CBC:  Recent Labs Lab 03/16/17 1801  WBC 6.7  NEUTROABS 5.6  HGB 13.1  HCT 40.9  MCV 95.1  PLT 201   Basic Metabolic Panel:  Recent Labs Lab 03/16/17 1801  NA 139  K 3.3*  CL 107  CO2 25  GLUCOSE  107*  BUN 11  CREATININE 0.35*  CALCIUM 9.1   GFR: CrCl cannot be calculated (Unknown ideal weight.). Liver Function Tests:  Recent Labs Lab 03/16/17 1801  AST 27  ALT 28  ALKPHOS 100  BILITOT 0.6  PROT 6.1*  ALBUMIN 3.2*   No results for input(s): LIPASE, AMYLASE in the last 168 hours. No results for input(s): AMMONIA in the last 168 hours. Coagulation Profile: No results for input(s): INR, PROTIME in the last 168 hours. Cardiac Enzymes: No results for input(s): CKTOTAL, CKMB, CKMBINDEX, TROPONINI in the last 168 hours. BNP (last 3 results) No results for input(s): PROBNP in the last 8760 hours. HbA1C: No results for input(s): HGBA1C  in the last 72 hours. CBG: No results for input(s): GLUCAP in the last 168 hours. Lipid Profile: No results for input(s): CHOL, HDL, LDLCALC, TRIG, CHOLHDL, LDLDIRECT in the last 72 hours. Thyroid Function Tests: No results for input(s): TSH, T4TOTAL, FREET4, T3FREE, THYROIDAB in the last 72 hours. Anemia Panel: No results for input(s): VITAMINB12, FOLATE, FERRITIN, TIBC, IRON, RETICCTPCT in the last 72 hours. Urine analysis:    Component Value Date/Time   COLORURINE YELLOW 01/28/2016 1036   APPEARANCEUR CLEAR 01/28/2016 1036   LABSPEC 1.020 01/28/2016 1036   PHURINE >9.0 (H) 01/28/2016 1036   GLUCOSEU NEGATIVE 01/28/2016 1036   HGBUR NEGATIVE 01/28/2016 1036   BILIRUBINUR NEGATIVE 01/28/2016 1036   KETONESUR NEGATIVE 01/28/2016 1036   PROTEINUR NEGATIVE 01/28/2016 1036   NITRITE NEGATIVE 01/28/2016 1036   LEUKOCYTESUR NEGATIVE 01/28/2016 1036    Radiological Exams on Admission: Dg Chest 2 View  Result Date: 03/16/2017 CLINICAL DATA:  Shortness of breath.  Cerebral palsy. EXAM: CHEST  2 VIEW COMPARISON:  01/28/2016 FINDINGS: Technically suboptimal exam due to patient's physical disability and atypical positioning. Asymmetric opacity is seen in the right upper lobe, suspicious for pneumonia. Left lung remains clear. No evidence of  pneumothorax or pleural effusion. Gaseous distention of colon noted. IMPRESSION: Technically suboptimal exam. Asymmetric right upper lobe opacity, suspicious for pneumonia. Recommend clinical correlation, and continued chest radiographic follow-up to confirm resolution. Electronically Signed   By: Myles Rosenthal M.D.   On: 03/16/2017 20:38    EKG: Independently reviewed.  Assessment/Plan Principal Problem:   Right upper lobe pneumonia (HCC) Active Problems:   CP (cerebral palsy) (HCC)    1. RUL PNA - 1. PNA pathway 2. Will leave on aztreonam and vanc for tonight 3. Cultures pending 4. IVF: NS at 74 2. CP - 1. Continue home meds 2. Takes pureed diet with nectar thick liquids 3. Pills need to be crushed in pudding  DVT prophylaxis: Lovenox Code Status: Full Family Communication: Caregiver at bedside Disposition Plan: Back home after admit Consults called: None Admission status: Place in [place], Heywood Iles. DO [facility] Pager [phone]  If 7AM-7PM, please contact day team taking care of patient [email or web address] Password [facility]  03/16/2017, 9:45 PM

## 2017-03-16 NOTE — ED Notes (Signed)
Pt caregiver is concerned about patient not having his medicine or feeding this evening. Told family that XRAY was called and they would be over to get him.

## 2017-03-16 NOTE — ED Provider Notes (Signed)
Emergency Department Provider Note   I have reviewed the triage vital signs and the nursing notes.   HISTORY  Chief Complaint Shortness of Breath   HPI Madaline BrilliantJohn Ahr is a 46 y.o. male with PMH of CP, AMI, and scoliosis presents to the emergency room in for evaluation of shortness of breath and offering. The history is primarily provided by the patient's home health aide at bedside. She reports for the past 2-3 days he's had increasing shortness of breath has been complaining of chest pain. She has noticed similar presentation with pneumonia in the past. She has tried albuterol nebulizer at home with minimal relief in symptoms. He has some left over amoxicillin and she began giving him this medication last night and this morning. No fevers or chills. No vomiting or diarrhea.   Level 5 caveat: Patient with CP and limited verbal ability.    Past Medical History:  Diagnosis Date  . Cerebral palsy (HCC)   . Constipation   . GERD (gastroesophageal reflux disease)   . MI (myocardial infarction) (HCC)   . Protein calorie malnutrition (HCC)   . Scoliosis     Patient Active Problem List   Diagnosis Date Noted  . CP (cerebral palsy) (HCC) 03/16/2017  . Right upper lobe pneumonia (HCC) 03/16/2017  . Closed nondisplaced supracondylar fracture of lower end of left femur without intracondylar extension with routine healing 12/02/2016    Past Surgical History:  Procedure Laterality Date  . ABDOMINAL SURGERY      Current Outpatient Rx  . Order #: [id number] Class: Historical Med  . Order #: [id number] Class: Print  . Order #: [id number] Class: Historical Med  . Order #: [id number] Class: Historical Med  . Order #: [id number] Class: Historical Med  . Order #: [id number] Class: Historical Med  . Order #: [id number] Class: Historical Med  . Order #: [id number] Class: Historical Med  . Order #: [id number] Class: Historical Med  . Order #: [id number] Class: Historical Med  . Order #: [id number] Class:  Historical Med  . Order #: [id number] Class: Historical Med  . Order #: [id number] Class: Historical Med  . Order #: [id number] Class: Historical Med  . Order #: [id number] Class: Historical Med  . Order #: [id number] Class: Historical Med  . Order #: [id number] Class: Historical Med  . Order #: [id number] Class: Historical Med  . Order #: [id number] Class: Historical Med    Allergies Sulfa antibiotics; Cefazolin; Erythromycin; and Ofloxacin  Family History  Problem Relation Age of Onset  . Heart attack Mother   . Heart attack Father     Social History Social History  Substance Use Topics  . Smoking status: Never Smoker  . Smokeless tobacco: Never Used  . Alcohol use No    Review of Systems  Level 5 caveat: CP and limited verbal ability.   ____________________________________________   PHYSICAL EXAM:  VITAL SIGNS: ED Triage Vitals [03/16/17 1750]  Enc Vitals Group     BP (!) 156/119     Pulse Rate (!) 112     Resp (!) 30     Temp 98.7 F (37.1 C)     Temp Source Oral     SpO2 (!) 89 %   Constitutional: Alert. Appears uncomfortable at bedside.  Eyes: Conjunctivae are normal.  Head: Atraumatic. Nose: No congestion/rhinnorhea. Mouth/Throat: Mucous membranes are dry.  Neck: No stridor.  Cardiovascular: Sinus tachycardia. Good peripheral circulation. Grossly normal heart sounds.   Respiratory: Increased respiratory effort.  No retractions. Lungs with diffuse crackles.  Gastrointestinal: Soft and nontender. No distention.  Musculoskeletal: Wasting of the extremities with  contracted LUE.  Neurologic: Flaccid paralysis of the LEs and contracted LUE. Unable to assess speech with baseline speech deficits. No other gross neurological deficits.  Skin:  Skin is warm, dry and intact. No rash noted.  ____________________________________________   LABS (all labs ordered are listed, but only abnormal results are displayed)  Labs Reviewed  COMPREHENSIVE METABOLIC PANEL - Abnormal;  Notable for the following:       Result Value   Potassium 3.3 (*)    Glucose, Bld 107 (*)    Creatinine, Ser 0.35 (*)    Total Protein 6.1 (*)    Albumin 3.2 (*)    All other components within normal limits  CBC WITH DIFFERENTIAL/PLATELET - Abnormal; Notable for the following:    Lymphs Abs 0.6 (*)    All other components within normal limits  CULTURE, BLOOD (ROUTINE X 2) W REFLEX TO ID PANEL  CULTURE, EXPECTORATED SPUTUM-ASSESSMENT  GRAM STAIN  BRAIN NATRIURETIC PEPTIDE  HIV ANTIBODY (ROUTINE TESTING)  STREP PNEUMONIAE URINARY ANTIGEN  I-STAT TROPOININ, ED  I-STAT CG4 LACTIC ACID, ED   ____________________________________________  EKG   EKG Interpretation  Date/Time:  Saturday March 16 2017 19:16:03 EDT Ventricular Rate:  101 PR Interval:    QRS Duration: 90 QT Interval:  393 QTC Calculation: 510 R Axis:   57 Text Interpretation:  Sinus tachycardia Low voltage, extremity leads Repol abnrm suggests ischemia, diffuse leads Prolonged QT interval No STEMI. Worsening ST depression from prior.  Confirmed by Alona Bene [phone]) on 03/16/2017 7:20:17 PM       ____________________________________________  RADIOLOGY  Dg Chest 2 View  Result Date: 03/16/2017 CLINICAL DATA:  Shortness of breath.  Cerebral palsy. EXAM: CHEST  2 VIEW COMPARISON:  01/28/2016 FINDINGS: Technically suboptimal exam due to patient's physical disability and atypical positioning. Asymmetric opacity is seen in the right upper lobe, suspicious for pneumonia. Left lung remains clear. No evidence of pneumothorax or pleural effusion. Gaseous distention of colon noted. IMPRESSION: Technically suboptimal exam. Asymmetric right upper lobe opacity, suspicious for pneumonia. Recommend clinical correlation, and continued chest radiographic follow-up to confirm resolution. Electronically Signed   By: Myles Rosenthal M.D.   On: 03/16/2017 20:38    ____________________________________________   PROCEDURES  Procedure(s)  performed:   Procedures  None ____________________________________________   INITIAL IMPRESSION / ASSESSMENT AND PLAN / ED COURSE  Pertinent labs & imaging results that were available during my care of the patient were reviewed by me and considered in my medical decision making (see chart for details).  Patient presents to the emergency department for evaluation of increased difficulty breathing and coughing. Exam and history are limited by the patient's underlying cerebral palsy and inability technique a verbally. The patient's health 8 at bedside is very helpful and knowledgeable. Plan for evaluation of possible pneumonia given his history. Currently living in a group home environment. Afebrile here but does have tachycardia, tachypnea, and hypoxemia on arrival.   08:50 PM Patient with evidence of pneumonia on chest x-ray. Updated caregiver at bedside. Starting antibiotics, blood cultures, plan on admission.  Discussed patient's case with hospitalist, Dr. Julian Reil. Patient and family (if present) updated with plan. Care transferred to Hospitalist service.  I reviewed all nursing notes, vitals, pertinent old records, EKGs, labs, imaging (as available).  ____________________________________________  FINAL CLINICAL IMPRESSION(S) / ED DIAGNOSES  Final diagnoses:  HCAP (healthcare-associated pneumonia)  Hypoxia  Cerebral palsy, unspecified type (HCC)     MEDICATIONS GIVEN DURING THIS VISIT:  Medications  aztreonam (AZACTAM) 2 g in  dextrose 5 % 50 mL IVPB (2 g Intravenous New Bag/Given 03/16/17 2158)  vancomycin (VANCOCIN) IVPB 1000 mg/200 mL premix (not administered)  enoxaparin (LOVENOX) injection 40 mg (not administered)  albuterol (PROVENTIL) (2.5 MG/3ML) 0.083% nebulizer solution 2.5 mg (not administered)  acetaminophen (TYLENOL) tablet 500 mg (not administered)  baclofen (LIORESAL) tablet 20 mg (not administered)  benzonatate (TESSALON) capsule 100 mg (not administered)    pantoprazole (PROTONIX) EC tablet 80 mg (not administered)  ferrous sulfate tablet 325 mg (not administered)  FLUoxetine (PROZAC) capsule 40 mg (not administered)  lactulose (CHRONULAC) 10 GM/15ML solution 20 g (not administered)  LORazepam (ATIVAN) tablet 0.5 mg (not administered)  magnesium oxide (MAG-OX) tablet 800 mg (not administered)  polyethylene glycol (MIRALAX / GLYCOLAX) packet 17 g (not administered)  gabapentin (NEURONTIN) capsule 300 mg (not administered)  guaiFENesin (ROBITUSSIN) 100 MG/5ML liquid 100 mg (not administered)  sucralfate (CARAFATE) tablet 1 g (not administered)  traZODone (DESYREL) tablet 150 mg (not administered)  traZODone (DESYREL) tablet 25-50 mg (not administered)  potassium chloride SA (K-DUR,KLOR-CON) CR tablet 20 mEq (not administered)  zinc oxide 20 % ointment 1 application (not administered)  bisacodyl (DULCOLAX) suppository 10 mg (not administered)  0.9 %  sodium chloride infusion (not administered)  ipratropium-albuterol (DUONEB) 0.5-2.5 (3) MG/3ML nebulizer solution 3 mL (3 mLs Nebulization Given 03/16/17 1850)     NEW OUTPATIENT MEDICATIONS STARTED DURING THIS VISIT:  None   Note:  This document was prepared using Dragon voice recognition software and may include unintentional dictation errors.  Alona Bene, MD Emergency Medicine   Long, Arlyss Repress, MD 03/16/17 2213

## 2017-03-16 NOTE — ED Notes (Signed)
IV infiltrated. RN attempted to start IV twice without success

## 2017-03-16 NOTE — ED Notes (Signed)
Consulting MD at bedside

## 2017-03-16 NOTE — ED Notes (Signed)
No addl blood draw in ED,  Pt preparing to go to inpatient floor.

## 2017-03-16 NOTE — ED Notes (Signed)
Attempted to call report

## 2017-03-17 ENCOUNTER — Encounter (HOSPITAL_COMMUNITY): Payer: Self-pay

## 2017-03-17 DIAGNOSIS — T17908A Unspecified foreign body in respiratory tract, part unspecified causing other injury, initial encounter: Secondary | ICD-10-CM | POA: Diagnosis not present

## 2017-03-17 DIAGNOSIS — I252 Old myocardial infarction: Secondary | ICD-10-CM | POA: Diagnosis not present

## 2017-03-17 DIAGNOSIS — L89152 Pressure ulcer of sacral region, stage 2: Secondary | ICD-10-CM | POA: Diagnosis present

## 2017-03-17 DIAGNOSIS — K2270 Barrett's esophagus without dysplasia: Secondary | ICD-10-CM | POA: Diagnosis present

## 2017-03-17 DIAGNOSIS — Z515 Encounter for palliative care: Secondary | ICD-10-CM | POA: Diagnosis present

## 2017-03-17 DIAGNOSIS — K210 Gastro-esophageal reflux disease with esophagitis: Secondary | ICD-10-CM | POA: Diagnosis not present

## 2017-03-17 DIAGNOSIS — J181 Lobar pneumonia, unspecified organism: Secondary | ICD-10-CM | POA: Diagnosis not present

## 2017-03-17 DIAGNOSIS — J189 Pneumonia, unspecified organism: Secondary | ICD-10-CM | POA: Diagnosis not present

## 2017-03-17 DIAGNOSIS — M419 Scoliosis, unspecified: Secondary | ICD-10-CM | POA: Diagnosis present

## 2017-03-17 DIAGNOSIS — J690 Pneumonitis due to inhalation of food and vomit: Secondary | ICD-10-CM | POA: Diagnosis present

## 2017-03-17 DIAGNOSIS — R1310 Dysphagia, unspecified: Secondary | ICD-10-CM | POA: Diagnosis present

## 2017-03-17 DIAGNOSIS — Z881 Allergy status to other antibiotic agents status: Secondary | ICD-10-CM | POA: Diagnosis not present

## 2017-03-17 DIAGNOSIS — E46 Unspecified protein-calorie malnutrition: Secondary | ICD-10-CM | POA: Diagnosis present

## 2017-03-17 DIAGNOSIS — K219 Gastro-esophageal reflux disease without esophagitis: Secondary | ICD-10-CM | POA: Diagnosis present

## 2017-03-17 DIAGNOSIS — I5022 Chronic systolic (congestive) heart failure: Secondary | ICD-10-CM | POA: Diagnosis present

## 2017-03-17 DIAGNOSIS — Z79899 Other long term (current) drug therapy: Secondary | ICD-10-CM | POA: Diagnosis not present

## 2017-03-17 DIAGNOSIS — Z66 Do not resuscitate: Secondary | ICD-10-CM | POA: Diagnosis present

## 2017-03-17 DIAGNOSIS — R0902 Hypoxemia: Secondary | ICD-10-CM | POA: Diagnosis present

## 2017-03-17 DIAGNOSIS — G809 Cerebral palsy, unspecified: Secondary | ICD-10-CM | POA: Diagnosis not present

## 2017-03-17 DIAGNOSIS — Z993 Dependence on wheelchair: Secondary | ICD-10-CM | POA: Diagnosis not present

## 2017-03-17 DIAGNOSIS — Z882 Allergy status to sulfonamides status: Secondary | ICD-10-CM | POA: Diagnosis not present

## 2017-03-17 DIAGNOSIS — G800 Spastic quadriplegic cerebral palsy: Secondary | ICD-10-CM | POA: Diagnosis present

## 2017-03-17 LAB — HIV ANTIBODY (ROUTINE TESTING W REFLEX): HIV Screen 4th Generation wRfx: NONREACTIVE

## 2017-03-17 MED ORDER — LORAZEPAM 2 MG/ML IJ SOLN
0.5000 mg | Freq: Four times a day (QID) | INTRAMUSCULAR | Status: DC | PRN
  Administered 2017-03-17 – 2017-03-18 (×4): 0.5 mg via INTRAVENOUS
  Filled 2017-03-17 (×4): qty 1

## 2017-03-17 MED ORDER — GUAIFENESIN ER 600 MG PO TB12
1200.0000 mg | ORAL_TABLET | Freq: Two times a day (BID) | ORAL | Status: DC
  Filled 2017-03-17: qty 2

## 2017-03-17 MED ORDER — DEXTROSE 5 % IV SOLN
1.0000 g | Freq: Three times a day (TID) | INTRAVENOUS | Status: DC
  Administered 2017-03-17 – 2017-03-18 (×4): 1 g via INTRAVENOUS
  Filled 2017-03-17 (×5): qty 1

## 2017-03-17 MED ORDER — SODIUM CHLORIDE 0.9 % IV SOLN
500.0000 mg | INTRAVENOUS | Status: DC
  Administered 2017-03-17: 500 mg via INTRAVENOUS
  Filled 2017-03-17: qty 500

## 2017-03-17 MED ORDER — GUAIFENESIN-DM 100-10 MG/5ML PO SYRP: 5.0000 mL | ORAL_SOLUTION | ORAL | Status: DC | PRN

## 2017-03-17 MED ORDER — RESOURCE THICKENUP CLEAR PO POWD
ORAL | Status: DC | PRN
  Filled 2017-03-17: qty 125

## 2017-03-17 MED ORDER — ENOXAPARIN SODIUM 30 MG/0.3ML ~~LOC~~ SOLN
30.0000 mg | SUBCUTANEOUS | Status: DC
  Administered 2017-03-18: 30 mg via SUBCUTANEOUS
  Filled 2017-03-17 (×2): qty 0.3

## 2017-03-17 NOTE — Progress Notes (Signed)
PROGRESS NOTE        PATIENT DETAILS Name: Malik Webb Age: 46 y.o. Sex: male Date of Birth: 1971/03/26 Admit Date: 03/16/2017 Admitting Physician Hillary Bow, DO ZOX:[id number], Charlaine Dalton, MD  Brief Narrative: Patient is a 46 y.o. male with history of spastic quadriplegic cerebral palsy mostly bed to wheelchair bound-and dependent on caregivers for 24/7 care including all activities of daily living-on a pure diet at home, admitted with several days history of cough, shortness of breath, found to have pneumonia and admitted to the hospitalist service for further evaluation and treatment. See below for further details  Subjective: Primary caregiver at bedside-has a weak cough-sounds congested. Looks comfortable-not in any distress.  Assessment/Plan: Probable aspiration pneumonia: Continue empiric antimicrobial therapy-await speech therapy evaluation. He still has pooling of secretions and significant amount of transmitted upper airway sounds-change Mucinex as scheduled, needs intensive chest PT and deep suctioning. He seems to have a weak cough reflex as well that is probably not helping. Hopefully he will respond to these measures, if not then may need palliative care evaluation. Have reached out to the patient's sister- left message-awaiting callback  History of spastic quadriplegic cerebral palsy: Bed to wheelchair bound-on a pure diet-dependent on caregiver for all ADLs. Continue baclofen, Neurontin, fluoxetine and as needed lorazepam.  History of GERD/Barrett's esophagitis: Continue with PPI and Carafate  Stage II sacral decubitus: Present prior to admission-wound care evaluation pending  Telemetry (independently reviewed): Sinus tachycardia  Chest x-ray (independently reviewed): Right upper lobe infiltrate  Old records review:  outpatient records in care everywhere-followed at [facility] had prior aspiration pneumonitis in the past.  Morning labs/Imaging  ordered: yes  DVT Prophylaxis: Prophylactic Lovenox   Code Status: Full code   Family Communication: Caregiver at bedside-left message for patient's sister  Disposition Plan: Remain inpatient-back to group home on discharge  Antimicrobial agents: Anti-infectives    Start     Dose/Rate Route Frequency Ordered Stop   03/17/17 2200  vancomycin (VANCOCIN) 500 mg in sodium chloride 0.9 % 100 mL IVPB     500 mg 100 mL/hr over 60 Minutes Intravenous Every 24 hours 03/17/17 0158     03/17/17 0600  aztreonam (AZACTAM) 1 g in dextrose 5 % 50 mL IVPB     1 g 100 mL/hr over 30 Minutes Intravenous Every 8 hours 03/17/17 0158     03/16/17 2100  aztreonam (AZACTAM) 2 g in dextrose 5 % 50 mL IVPB     2 g 100 mL/hr over 30 Minutes Intravenous  Once 03/16/17 2049 03/16/17 2301   03/16/17 2100  vancomycin (VANCOCIN) IVPB 1000 mg/200 mL premix     1,000 mg 200 mL/hr over 60 Minutes Intravenous  Once 03/16/17 2049 03/17/17 0003      Procedures: None  CONSULTS:  None  Time spent: 25 minutes-Greater than 50% of this time was spent in counseling, explanation of diagnosis, planning of further management, and coordination of care.  MEDICATIONS: Scheduled Meds: . baclofen  20 mg Oral TID  . enoxaparin (LOVENOX) injection  40 mg Subcutaneous Q24H  . ferrous sulfate  325 mg Oral q morning - 10a  . FLUoxetine  40 mg Oral Daily  . gabapentin  300 mg Oral TID  . magnesium oxide  800 mg Oral BID  . pantoprazole (PROTONIX) IV  40 mg Intravenous Q12H  . potassium chloride  20 mEq Oral Daily  . sucralfate  1 g Oral TID AC & HS  . traZODone  150 mg Oral QHS  . zinc oxide  1 application Topical BID   Continuous Infusions: . sodium chloride 75 mL/hr at 03/17/17 0527  . aztreonam 1 g (03/17/17 0528)  . vancomycin     PRN Meds:.acetaminophen, albuterol, benzonatate, bisacodyl, guaiFENesin, lactulose, LORazepam, polyethylene glycol, traZODone   PHYSICAL EXAM: Vital signs: Vitals:   03/16/17  2000 03/16/17 2349 03/17/17 0005 03/17/17 0507  BP: (!) 136/98  (!) 157/75 129/71  Pulse:   (!) 101 93  Resp: (!) 36  20 18  Temp:   98.8 F (37.1 C)   TempSrc:   Oral Oral  SpO2:   92% 94%  Weight:  29.9 kg (66 lb)    Height:  4\' 9"  (1.448 m)     Filed Weights   03/16/17 2349  Weight: 29.9 kg (66 lb)   Body mass index is 14.28 kg/m.   General appearance:Awake-with caregiver at bedside-follows some commands. Speech is unintelligible Eyes:, pupils equally reactive to light and accomodation HEENT: Atraumatic and Normocephalic Neck: supple Resp: Moving air-significant transmitted upper airway sounds. CVS: S1 S2 regular, no murmurs.  GI: Bowel sounds present, Non tender and not distended  Extremities: Significant muscle wasting on all 4 extremities Neurology:  Spastic quadriplegic Musculoskeletal:No digital cyanosis Skin:No Rash, warm and dry  I have personally reviewed following labs and imaging studies  LABORATORY DATA: CBC:  Recent Labs Lab 03/16/17 1801  WBC 6.7  NEUTROABS 5.6  HGB 13.1  HCT 40.9  MCV 95.1  PLT 201    Basic Metabolic Panel:  Recent Labs Lab 03/16/17 1801  NA 139  K 3.3*  CL 107  CO2 25  GLUCOSE 107*  BUN 11  CREATININE 0.35*  CALCIUM 9.1    GFR: Estimated Creatinine Clearance: 48.8 mL/min (A) (by C-G formula based on SCr of 0.35 mg/dL (L)).  Liver Function Tests:  Recent Labs Lab 03/16/17 1801  AST 27  ALT 28  ALKPHOS 100  BILITOT 0.6  PROT 6.1*  ALBUMIN 3.2*   No results for input(s): LIPASE, AMYLASE in the last 168 hours. No results for input(s): AMMONIA in the last 168 hours.  Coagulation Profile: No results for input(s): INR, PROTIME in the last 168 hours.  Cardiac Enzymes: No results for input(s): CKTOTAL, CKMB, CKMBINDEX, TROPONINI in the last 168 hours.  BNP (last 3 results) No results for input(s): PROBNP in the last 8760 hours.  HbA1C: No results for input(s): HGBA1C in the last 72 hours.  CBG: No  results for input(s): GLUCAP in the last 168 hours.  Lipid Profile: No results for input(s): CHOL, HDL, LDLCALC, TRIG, CHOLHDL, LDLDIRECT in the last 72 hours.  Thyroid Function Tests: No results for input(s): TSH, T4TOTAL, FREET4, T3FREE, THYROIDAB in the last 72 hours.  Anemia Panel: No results for input(s): VITAMINB12, FOLATE, FERRITIN, TIBC, IRON, RETICCTPCT in the last 72 hours.  Urine analysis:    Component Value Date/Time   COLORURINE YELLOW 01/28/2016 1036   APPEARANCEUR CLEAR 01/28/2016 1036   LABSPEC 1.020 01/28/2016 1036   PHURINE >9.0 (H) 01/28/2016 1036   GLUCOSEU NEGATIVE 01/28/2016 1036   HGBUR NEGATIVE 01/28/2016 1036   BILIRUBINUR NEGATIVE 01/28/2016 1036   KETONESUR NEGATIVE 01/28/2016 1036   PROTEINUR NEGATIVE 01/28/2016 1036   NITRITE NEGATIVE 01/28/2016 1036   LEUKOCYTESUR NEGATIVE 01/28/2016 1036    Sepsis Labs: Lactic Acid, Venous    Component Value Date/Time  LATICACIDVEN 1.48 01/28/2016 1022    MICROBIOLOGY: No results found for this or any previous visit (from the past 240 hour(s)).  RADIOLOGY STUDIES/RESULTS: Dg Chest 2 View  Result Date: 03/16/2017 CLINICAL DATA:  Shortness of breath.  Cerebral palsy. EXAM: CHEST  2 VIEW COMPARISON:  01/28/2016 FINDINGS: Technically suboptimal exam due to patient's physical disability and atypical positioning. Asymmetric opacity is seen in the right upper lobe, suspicious for pneumonia. Left lung remains clear. No evidence of pneumothorax or pleural effusion. Gaseous distention of colon noted. IMPRESSION: Technically suboptimal exam. Asymmetric right upper lobe opacity, suspicious for pneumonia. Recommend clinical correlation, and continued chest radiographic follow-up to confirm resolution. Electronically Signed   By: Myles RosenthalJohn  Stahl M.D.   On: 03/16/2017 20:38     LOS: 0 days   Jeoffrey MassedShanker Ghimire, MD  [facility] Pager:[phone]  If 7PM-7AM, please contact night-coverage [email or web address] Password  [email or web address] 03/17/2017, 9:27 AM

## 2017-03-17 NOTE — Progress Notes (Signed)
Pharmacy Antibiotic Note  Malik Webb is a 46 y.o. male admitted on 03/16/2017 with pneumonia.  Pharmacy has been consulted for Aztreonam and Vancomycin dosing. Pt with cerebral palsy.  Pt received Vanc 1gm, Aztreonam 2gm in ED ~2200  Plan: Aztreonam 1gm IV q8h Vancomycin 500mg  IV q24h Will f/u micro data, renal function, and pt's clinical condition Vanc trough prn   Height: 4\' 9"  (144.8 cm) Weight: 66 lb (29.9 kg) IBW/kg (Calculated) : 43.1  Temp (24hrs), Avg:98.8 F (37.1 C), Min:98.7 F (37.1 C), Max:98.8 F (37.1 C)   Recent Labs Lab 03/16/17 1801  WBC 6.7  CREATININE 0.35*    Estimated Creatinine Clearance: 48.8 mL/min (A) (by C-G formula based on SCr of 0.35 mg/dL (L)).    Allergies  Allergen Reactions  . Sulfa Antibiotics Diarrhea    (Already present in Epic, but no allergies/intolerances were noted on MAR)  . Cefazolin Rash    (Already present in Epic, but no allergies were noted on MAR)  . Erythromycin Rash    (Already present in Epic, but no allergies were noted on MAR)  . Ofloxacin Rash    Possible rash, but can tolerate Avelox (Already present in Epic, but [facility])    Antimicrobials this admission: 6/2 Vanc >>  6/2 Aztreonam >>   Dose adjustments this admission: n/a  Microbiology results: 6/2 BCx:   Thank you for allowing pharmacy to be a part of this patient's care.  Christoper Fabianaron Yuvan Medinger, PharmD, BCPS Clinical pharmacist, pager [phone] 03/17/2017 1:49 AM

## 2017-03-17 NOTE — Evaluation (Signed)
Clinical/Bedside Swallow Evaluation Patient Details  Name: Malik Webb MRN: [id number] Date of Birth: 07/16/1971  Today's Date: 03/17/2017 Time: SLP Start Time (ACUTE ONLY): 1028 SLP Stop Time (ACUTE ONLY): 1050 SLP Time Calculation (min) (ACUTE ONLY): 22 min  Past Medical History:  Past Medical History:  Diagnosis Date  . Cerebral palsy (HCC)   . Constipation   . GERD (gastroesophageal reflux disease)   . MI (myocardial infarction) (HCC)   . Protein calorie malnutrition (HCC)   . Scoliosis    Past Surgical History:  Past Surgical History:  Procedure Laterality Date  . ABDOMINAL SURGERY     HPI:  Patient is a 46 y.o.malewith history of spastic quadriplegic cerebral palsy mostly bed to wheelchair bound-and dependent on caregivers for 24/7 care including all activities of daily living-on a pure diet at home, admitted with several days history of cough, shortness of breath, found to have pneumonia and admitted to the hospitalist service for further evaluation and treatment. MD notes probable aspiration pneumonia, pooling of secretions, requiring deep suctioning, weak cough reflex. [facility], prior aspiration pneumonitis, admission 12/12/14 with upper GI hemorrhage, erosive esophagitis, Barrett's esophagus was discharged on pureed diet with nectar-thick liquids, with strict aspiration precautions. [facility] ST notes indicate pt inappropriate for instrumental evaluations given severe cognitive impairment, physical challenges. Noted high aspiration risk, recommended nectar-thick liquids and puree via syringe.    Assessment / Plan / Recommendation Clinical Impression  Patient presents with severe, multifactorial dysphagia and severe risk for aspiration in the setting of developmental disability with severe oral impairments, weak cough reflex, and poor postural control, history of esophageal dysphagia including Barrett's esophagus, reflux, erosive esophagitis, current pneumonia, and cognitive  impairment. Patient's caregiver present and feeds pt for bedside swallowing evaluation. Pt's maximal upright posture is 75 degrees in wheelchair; caregiver feeds nectar and honey-thick liquids via 60 ml syringe; pt with wet moaning, weak, wet cough, multiple swallows with all consistencies trialed, suggestive of reduced airway protection. Caregiver states that prior to this hospitalization, pt was tolerating nectar-thick liquids and pureed diet at her home, where he lives. She states, "This isn't him" in response to his grimacing, moaning, coughing with PO intake. She states pt had a PEG tube in the past, "his mom wanted it out so she could feed him," expresses interest in replacing PEG. SLP provided education re: possible contraindications given pt's esophageal history and cognitive impairment. With postural limitations, instrumental assessment (MBS) not appropriate for this pt. Suspect it is unlikely give pt's cognitive impairment that he would tolerate FEES. Given the medical complexity and multifactorial nature of pt's dysphagia, recommend palliative consult to assist with clarifying goals of care. SLP will follow briefly for education, comfort feeding recommendations if appropriate. SLP Visit Diagnosis: Dysphagia, unspecified (R13.10)    Aspiration Risk  Severe aspiration risk;Risk for inadequate nutrition/hydration    Diet Recommendation NPO   Medication Administration: Via alternative means    Other  Recommendations Recommended Consults: Other (Comment) (Palliative consult) Oral Care Recommendations: Oral care QID   Follow up Recommendations Other (comment) (TBD)      Frequency and Duration min 1 x/week  1 week       Prognosis Prognosis for Safe Diet Advancement: Guarded Barriers to Reach Goals: Cognitive deficits;Severity of deficits;Time post onset;Language deficits      Swallow Study   General Date of Onset: 03/16/17 HPI: Patient is a 46 y.o.malewith history of spastic  quadriplegic cerebral palsy mostly bed to wheelchair bound-and dependent on caregivers for 24/7 care including  all activities of daily living-on a pure diet at home, admitted with several days history of cough, shortness of breath, found to have pneumonia and admitted to the hospitalist service for further evaluation and treatment. MD notes probable aspiration pneumonia, pooling of secretions, requiring deep suctioning, weak cough reflex. Followed at [facility], prior aspiration pneumonitis, admission 12/12/14 with upper GI hemorrhage, erosive esophagitis, Barrett's esophagus was discharged on pureed diet with nectar-thick liquids, with strict aspiration precautions. [facility] ST notes indicate pt inappropriate for instrumental evaluations given severe cognitive impairment, physical challenges. Noted high aspiration risk, recommended nectar-thick liquids and puree via syringe.  Type of Study: Bedside Swallow Evaluation Previous Swallow Assessment: extensive history of oropharyngeal and esophageal dysphagia. see HPI Diet Prior to this Study: Dysphagia 1 (puree);Nectar-thick liquids Temperature Spikes Noted: No Respiratory Status: Room air History of Recent Intubation: No Behavior/Cognition: Alert;Doesn't follow directions;Agitated Oral Cavity Assessment: Other (comment) (difficult to assess) Oral Care Completed by SLP: No Oral Cavity - Dentition: Missing dentition Vision: Impaired for self-feeding Self-Feeding Abilities: Total assist Patient Positioning: Other (comment) (approx 75 degrees in wheelchair) Baseline Vocal Quality: Not observed Volitional Cough: Cognitively unable to elicit Volitional Swallow: Unable to elicit    Oral/Motor/Sensory Function Overall Oral Motor/Sensory Function: Severe impairment   Ice Chips Ice chips: Not tested   Thin Liquid Thin Liquid: Not tested    Nectar Thick Nectar Thick Liquid: Impaired Presentation:  (60 ml syringe) Oral Phase Impairments: Reduced labial seal;Reduced  lingual movement/coordination;Poor awareness of bolus Oral phase functional implications: Prolonged oral transit Pharyngeal Phase Impairments: Wet Vocal Quality;Cough - Immediate;Multiple swallows   Honey Thick Honey Thick Liquid: Impaired Presentation:  (syringe) Oral Phase Impairments: Impaired mastication;Reduced lingual movement/coordination;Reduced labial seal;Poor awareness of bolus Oral Phase Functional Implications: Prolonged oral transit Pharyngeal Phase Impairments: Multiple swallows;Wet Vocal Quality;Cough - Immediate   Puree Puree: Impaired Presentation: Spoon Oral Phase Impairments: Reduced labial seal;Reduced lingual movement/coordination;Poor awareness of bolus;Impaired mastication Oral Phase Functional Implications: Prolonged oral transit Pharyngeal Phase Impairments: Cough - Immediate;Multiple swallows;Wet Vocal Quality   Solid   GO   Solid: Not tested    Functional Assessment Tool Used: skilled clinical judgment Functional Limitations: Swallowing Swallow Current Status (W0981): At least 80 percent but less than 100 percent impaired, limited or restricted Swallow Goal Status [phone]): At least 80 percent but less than 100 percent impaired, limited or restricted   Arlana Lindau 03/17/2017,1:42 PM  Rondel Baton, MS, CCC-SLP Speech-Language Pathologist [phone]

## 2017-03-17 NOTE — Consult Note (Signed)
Consultation Note Date: 03/17/2017   Patient Name: Malik Webb  DOB: Jun 24, 1971  MRN: [id number]  Age / Sex: 46 y.o., male  PCP: Andi Devon, MD Referring Physician: Jonetta Osgood, MD  Reason for Consultation: Establishing goals of care and Psychosocial/spiritual support  HPI/Patient Profile: 46 y.o. male  with past medical history of Cerebral palsy, spastic quadriplegic, stage II sacral decubitus, GERD/Barrett's esophagitis, MI admitted on 03/16/2017 with increased cough, shortness of breath. Patient was found to have a right upper lobe infiltrate. Most of patient's care has been delivered at [facility]. Patient resides in a group home, but primarily lives with his caregiver, Malik Webb. His sister, Malik Webb is his legal guardian.   Clinical Assessment and Goals of Care: Met with patient, his caregiver, Malik Webb, and Presenter, broadcasting. Malik Webb, and Dr. Sloan Leiter to discuss goals of care in setting of another episode of aspiration pneumonia. Patient has had a feeding tube in the past at the age of 77. Reviewed current risks versus benefits for another feeding tube in the setting of severe aspiration pneumonia. Family recognizes that as patient has aged, surviving much longer than anyone had predicted,  that given the severity of his aspirations including aspirating his own saliva and secretions, a feeding tube would not be in his best interest. We also discussed other issues relevant to patient's care such as CODE STATUS, IV fluids as well as antibiotics.  Patient is unable to make his own healthcare decisions, and his legal guardian, Malik Webb is his healthcare proxy [phone]. Additionally he has an excellent caregiver, Malik Webb who is listed as his primary emergency contact as he does live with her; [phone]    SUMMARY OF RECOMMENDATIONS   DO NOT RESUSCITATE DO NOT  INTUBATE No feeding tube Allow patient to eat for comfort; family understands and accepts risks that he will aspirate Continue to treat the treatable such as IV fluids as indicated and IV antibiotics Patient would benefit  greatly by having an airway clearance vest machine in the home. Order placed to care management to help facilitate this MOST form completed. Mrs. Webb has the original and she transports patient to and from the hospital  Code Status/Advance Care Planning:  DNR  Palliative Prophylaxis:   Aspiration, Bowel Regimen, Eye Care, Frequent Pain Assessment, Oral Care and Turn Reposition  Additional Recommendations (Limitations, Scope, Preferences):  Avoid Hospitalization, Initiate Comfort Feeding, No Artificial Feeding, No Chemotherapy, No Hemodialysis, No Radiation, No Surgical Procedures and No Tracheostomy  Psycho-social/Spiritual:   Desire for further Chaplaincy support:no   Prognosis:   Unable to determine. Patient might qualify for his in-home hospice benefit given that he is such a high risk of aspiration which ultimately could lead to his death  Discharge Planning: Back home with his caregiver, Malik Webb      Primary Diagnoses: Present on Admission: . CP (cerebral palsy) ([place]) . Right upper lobe pneumonia ([place])   I have reviewed the medical record, interviewed the patient and family, and examined the patient.  The following aspects are pertinent.  Past Medical History:  Diagnosis Date  . Cerebral palsy ([place])   . Constipation   . GERD (gastroesophageal reflux disease)   . MI (myocardial infarction) ([place])   . Protein calorie malnutrition ([place])   . Scoliosis    Social History   Social History  . Marital status: Single    Spouse name: N/A  . Number of children: N/A  . Years of education: N/A   Social History Main Topics  . Smoking status: Never Smoker  . Smokeless tobacco: Never Used  . Alcohol use No  . Drug use: No  . Sexual activity: Not  Asked   Other Topics Concern  . None   Social History Narrative  . None   Family History  Problem Relation Age of Onset  . Heart attack Mother   . Heart attack Father    Scheduled Meds: . baclofen  20 mg Oral TID  . [START ON 03/18/2017] enoxaparin (LOVENOX) injection  30 mg Subcutaneous Q24H  . ferrous sulfate  325 mg Oral q morning - 10a  . FLUoxetine  40 mg Oral Daily  . gabapentin  300 mg Oral TID  . magnesium oxide  800 mg Oral BID  . pantoprazole (PROTONIX) IV  40 mg Intravenous Q12H  . potassium chloride  20 mEq Oral Daily  . sucralfate  1 g Oral TID AC & HS  . traZODone  150 mg Oral QHS  . zinc oxide  1 application Topical BID   Continuous Infusions: . sodium chloride 75 mL/hr at 03/17/17 1126  . aztreonam Stopped (03/17/17 1343)  . vancomycin     PRN Meds:.acetaminophen, albuterol, benzonatate, bisacodyl, guaiFENesin-dextromethorphan, lactulose, LORazepam, LORazepam, polyethylene glycol, traZODone Medications Prior to Admission:  Prior to Admission medications   Medication Sig Start Date End Date Taking? Authorizing Provider  acetaminophen (TYLENOL) 500 MG tablet Take 500 mg by mouth every 6 (six) hours as needed (for pain).    Yes [provider]  albuterol (PROVENTIL) (2.5 MG/3ML) 0.083% nebulizer solution Take 3 mLs (2.5 mg total) by nebulization every 6 (six) hours as needed for wheezing or shortness of breath. Patient taking differently: Take 2.5 mg by nebulization every 4 (four) hours as needed (for coughing).  01/28/16  Yes Joy, Shawn C, PA-C  baclofen (LIORESAL) 20 MG tablet Take 20 mg by mouth 3 (three) times daily.   Yes [provider]  benzonatate (TESSALON) 100 MG capsule Take 100 mg by mouth up to two times a day as needed for cough 06/06/16  Yes [provider]  bisacodyl (DULCOLAX) 10 MG suppository Place 10 mg rectally daily as needed (for constipation/if no BM in 2 days).   Yes [provider]  esomeprazole (NEXIUM)  40 MG capsule Take 40 mg by mouth 2 (two) times daily.   Yes [provider]  ferrous sulfate 325 (65 FE) MG tablet Take 325 mg by mouth every morning.    Yes [provider]  FLUoxetine (PROZAC) 40 MG capsule Take 40 mg by mouth in the morning 02/23/16  Yes [provider]  gabapentin (NEURONTIN) 300 MG capsule Take 300 mg by mouth 3 (three) times daily.   Yes [provider]  guaiFENesin (HM TUSSIN ADULT) 100 MG/5ML liquid Take 100 mg by mouth every 4 (four) hours as needed for cough.   Yes [provider]  lactulose (CONSTULOSE) 10 GM/15ML solution Take 20 g by mouth 4 (four) times daily as needed for mild constipation.  Yes [provider]  LORazepam (ATIVAN) 0.5 MG tablet Take 0.5 mg by mouth at bedtime as needed for anxiety.    Yes [provider]  magnesium oxide (MAG-OX) 400 MG tablet Take 800 mg by mouth 2 (two) times daily.   Yes [provider]  polyethylene glycol (MIRALAX / GLYCOLAX) packet Take 17 g by mouth daily as needed for mild constipation. MIX INTO 4-8 OUNCES OF WATER AND DRINK   Yes [provider]  potassium chloride (K-DUR,KLOR-CON) 10 MEQ tablet Take 20 mEq by mouth daily.    Yes [provider]  sucralfate (CARAFATE) 1 g tablet Take 1 g by mouth 4 (four) times daily -  before meals and at bedtime.  02/27/16  Yes [provider]  traZODone (DESYREL) 150 MG tablet Take 150 mg by mouth at bedtime.   Yes [provider]  traZODone (DESYREL) 50 MG tablet Take 25-50 mg by mouth See admin instructions. WITH LUNCH AS NEEDED FOR AGITATION   Yes [provider]  zinc oxide 20 % ointment Apply 1 application topically 2 (two) times daily. TO AFFECTED AREA   Yes [provider]   Allergies  Allergen Reactions  . Sulfa Antibiotics Diarrhea    (Already present in Epic, but no allergies/intolerances were noted on MAR)  . Cefazolin Rash    (Already present in Epic,  but no allergies were noted on MAR)  . Erythromycin Rash    (Already present in Epic, but no allergies were noted on MAR)  . Ofloxacin Rash    Possible rash, but can tolerate Avelox (Already present in Epic, but no allergies were noted on MAR)   Review of Systems  Unable to perform ROS: Patient nonverbal    Physical Exam  Constitutional:  Extremely frail middle-aged man with cerebral palsy  Pulmonary/Chest:  Very congested  Musculoskeletal:  Contractures of upper and lower extremities Weight 66 pounds  Neurological: He is alert.  Unable to test orientation. Patient is nonverbal, coos, cries Quadriplegic  Skin: Skin is warm and dry.  Psychiatric:  Unable to test  Nursing note and vitals reviewed.   Vital Signs: BP 129/71 (BP Location: Left Leg)   Pulse 93   Temp 98.8 F (37.1 C) (Oral)   Resp 18   Ht 4' 9"  (1.448 m)   Wt 29.9 kg (66 lb)   SpO2 94%   BMI 14.28 kg/m  Pain Assessment: Faces       SpO2: SpO2: 94 % O2 Device:SpO2: 94 % O2 Flow Rate: .   IO: Intake/output summary:  Intake/Output Summary (Last 24 hours) at 03/17/17 1506 Last data filed at 03/17/17 0900  Gross per 24 hour  Intake              240 ml  Output             1000 ml  Net             -760 ml    LBM: Last BM Date: 03/16/17 Baseline Weight: Weight: 29.9 kg (66 lb) Most recent weight: Weight: 29.9 kg (66 lb)     Palliative Assessment/Data:   Flowsheet Rows     Most Recent Value  Intake Tab  Referral Department  Hospitalist  Unit at Time of Referral  Med/Surg Unit  Palliative Care Primary Diagnosis  Sepsis/Infectious Disease  Date Notified  03/17/17  Palliative Care Type  New Palliative care  Reason for referral  Clarify Goals of Care  Date of Admission  03/16/17  Date first seen by Palliative Care  03/17/17  # of days Palliative referral response time  0 Day(s)  # of days IP prior to Palliative referral  1  Clinical Assessment  Palliative Performance Scale Score  20%  Pain  Max last 24 hours  Not able to report  Pain Min Last 24 hours  Not able to report  Dyspnea Max Last 24 Hours  Not able to report  Dyspnea Min Last 24 hours  Not able to report  Nausea Max Last 24 Hours  Not able to report  Nausea Min Last 24 Hours  Not able to report  Anxiety Max Last 24 Hours  Not able to report  Anxiety Min Last 24 Hours  Not able to report  Other Max Last 24 Hours  Not able to report  Psychosocial & Spiritual Assessment  Palliative Care Outcomes  Patient/Family meeting held?  Yes  Who was at the meeting?  caregiver, legal guardian  Palliative Care Outcomes  Clarified goals of care, Provided psychosocial or spiritual support, Changed CPR status, Completed durable DNR, Provided advance care planning  Patient/Family wishes: Interventions discontinued/not started   Mechanical Ventilation, Hemodialysis, Vasopressors, Trach, Tube feedings/TPN, PEG  Palliative Care follow-up planned  No      Time In: 1400 Time Out: 1510 Time Total: 70 min Greater than 50%  of this time was spent counseling and coordinating care related to the above assessment and plan. Staffed with Dr. Sloan Leiter  Signed by: Dory Horn, NP   Please contact Palliative Medicine Team phone at [phone] for questions and concerns.  For individual provider: See Shea Evans

## 2017-03-18 DIAGNOSIS — J690 Pneumonitis due to inhalation of food and vomit: Principal | ICD-10-CM

## 2017-03-18 DIAGNOSIS — Z515 Encounter for palliative care: Secondary | ICD-10-CM

## 2017-03-18 DIAGNOSIS — T17908A Unspecified foreign body in respiratory tract, part unspecified causing other injury, initial encounter: Secondary | ICD-10-CM

## 2017-03-18 DIAGNOSIS — K219 Gastro-esophageal reflux disease without esophagitis: Secondary | ICD-10-CM

## 2017-03-18 LAB — BASIC METABOLIC PANEL
Anion gap: 10 (ref 5–15)
BUN: 10 mg/dL (ref 6–20)
CALCIUM: 9.1 mg/dL (ref 8.9–10.3)
CHLORIDE: 106 mmol/L (ref 101–111)
CO2: 26 mmol/L (ref 22–32)
CREATININE: 0.3 mg/dL — AB (ref 0.61–1.24)
GFR calc Af Amer: >60 mL/min (ref >60)
GFR calc non Af Amer: >60 mL/min (ref >60)
GLUCOSE: 74 mg/dL (ref 65–99)
Potassium: 3.6 mmol/L (ref 3.5–5.1)
Sodium: 142 mmol/L (ref 135–145)

## 2017-03-18 LAB — CBC
HEMATOCRIT: 41.5 % (ref 39.0–52.0)
HEMOGLOBIN: 13.2 g/dL (ref 13.0–17.0)
MCH: 30.2 pg (ref 26.0–34.0)
MCHC: 31.8 g/dL (ref 30.0–36.0)
MCV: 95 fL (ref 78.0–100.0)
Platelets: 187 10*3/uL (ref 150–400)
RBC: 4.37 MIL/uL (ref 4.22–5.81)
RDW: 13.6 % (ref 11.5–15.5)
WBC: 4.8 10*3/uL (ref 4.0–10.5)

## 2017-03-18 MED ORDER — LORAZEPAM 2 MG/ML IJ SOLN
0.5000 mg | INTRAMUSCULAR | Status: DC | PRN
  Administered 2017-03-18 – 2017-03-19 (×2): 0.5 mg via INTRAVENOUS
  Filled 2017-03-18 (×2): qty 1

## 2017-03-18 MED ORDER — MORPHINE SULFATE (PF) 2 MG/ML IV SOLN
1.0000 mg | INTRAVENOUS | Status: DC | PRN
  Administered 2017-03-18: 1 mg via INTRAVENOUS
  Filled 2017-03-18: qty 1

## 2017-03-18 MED ORDER — ALBUTEROL SULFATE (2.5 MG/3ML) 0.083% IN NEBU
2.5000 mg | INHALATION_SOLUTION | Freq: Four times a day (QID) | RESPIRATORY_TRACT | Status: DC
  Administered 2017-03-19: 2.5 mg via RESPIRATORY_TRACT
  Filled 2017-03-18 (×2): qty 3

## 2017-03-18 MED ORDER — SODIUM CHLORIDE 0.9 % IV SOLN
1.5000 g | Freq: Three times a day (TID) | INTRAVENOUS | Status: DC
  Administered 2017-03-18 – 2017-03-19 (×2): 1.5 g via INTRAVENOUS
  Filled 2017-03-18 (×5): qty 1.5

## 2017-03-18 NOTE — Consult Note (Signed)
WOC Nurse wound consult note Reason for Consult: Consult requested for sacrum Wound type: Pt was noted to have a stage 2 pressure injury upon admission.  He is very emaciated and immobile with protruding sacrum bone which is difficult to reduce pressure, related to curvature of his spine. Pressure Injury POA: Yes Measurement: 1X1X.1 cm Wound bed: pink and moist Drainage (amount, consistency, odor) No odor or drainage Periwound: Intact skin surrounding Dressing procedure/placement/frequency: Foam dressing to protect and promote healing.  Discussed plan of care with family member at the bedside. Please re-consult if further assistance is needed.  Thank-you,  Cammie Mcgeeawn General Wearing MSN, RN, CWOCN, [place]CN-AP, CNS [phone]

## 2017-03-18 NOTE — Progress Notes (Signed)
RT came to do CPT Vest with pt. Pt sitting in chair very agitated. Family member states he is in pain. RN notified. [facility] this time due to pt being in pain and agitated. RN aware

## 2017-03-18 NOTE — Progress Notes (Signed)
PROGRESS NOTE        PATIENT DETAILS Name: Malik BrilliantJohn Webb Age: 46 y.o. Sex: male Date of Birth: 09-Mar-1971 Admit Date: 03/16/2017 Admitting Physician Hillary BowJared M Gardner, DO ZOX:[id number]:Shipley, Charlaine DaltonMichael B, MD  Brief Narrative: Patient is a 46 y.o. male with history of spastic quadriplegic cerebral palsy mostly bed to wheelchair bound-and dependent on caregivers for 24/7 care including all activities of daily living-on a pure diet at home, admitted with several days history of cough, shortness of breath, found to have pneumonia and admitted to the hospitalist service for further evaluation and treatment. See below for further details  Subjective: No major issues overnight-unable to obtain history from patient-per RN and primary caregiver at bedside-he is unchanged from yesterday..  Assessment/Plan: Aspiration pneumonia: Remains unchanged-no leukocytosis-afebrile. Continues to have transmitted upper airway sounds on exam-may be pooling some secretions in his throat. Will change antimicrobial therapy to Unasyn-does not have a penicillin allergy. Plans are to continue with Mucinex/intensive chest physical therapy. Had a long conversation with the patient's sister yesterday-apparently patient enjoys eating-given his overall poor health-this is one of one of the things he enjoys and gets pleasure out of. After discussion, family agreeable to initiate comfort feedings-SLP in reconsult at-up recommendations are to start dysphagia 1 diet. Family accepting all risks. He is now a DO NOT RESUSCITATE.   History of spastic quadriplegic cerebral palsy: Bed to wheelchair bound-on a pure diet-dependent on caregiver for all ADLs. Continue baclofen, Neurontin, fluoxetine and as needed lorazepam.  History of GERD/Barrett's esophagitcontinue PPI and Carafate  Stage II sacral decubitus: Present prior to admissionawait wound care evaluation  Morning labs/Imaging ordered: no  DVT  Prophylaxis: Prophylactic Lovenox   Code Status: Full code   Family Communication: Caregiver at bedside this morning  Disposition Plan: Remain inpatient-back to group home on discharge-hopefully on 6/5   Antimicrobial agents: Anti-infectives    Start     Dose/Rate Route Frequency Ordered Stop   03/18/17 0845  ampicillin-sulbactam (UNASYN) 1.5 g in sodium chloride 0.9 % 50 mL IVPB     1.5 g 100 mL/hr over 30 Minutes Intravenous Every 8 hours 03/18/17 0832     03/17/17 2200  vancomycin (VANCOCIN) 500 mg in sodium chloride 0.9 % 100 mL IVPB  Status:  Discontinued     500 mg 100 mL/hr over 60 Minutes Intravenous Every 24 hours 03/17/17 0158 03/18/17 0824   03/17/17 0600  aztreonam (AZACTAM) 1 g in dextrose 5 % 50 mL IVPB  Status:  Discontinued     1 g 100 mL/hr over 30 Minutes Intravenous Every 8 hours 03/17/17 0158 03/18/17 0824   03/16/17 2100  aztreonam (AZACTAM) 2 g in dextrose 5 % 50 mL IVPB     2 g 100 mL/hr over 30 Minutes Intravenous  Once 03/16/17 2049 03/16/17 2301   03/16/17 2100  vancomycin (VANCOCIN) IVPB 1000 mg/200 mL premix     1,000 mg 200 mL/hr over 60 Minutes Intravenous  Once 03/16/17 2049 03/17/17 0003      Procedures: None  CONSULTS: Palliative  Time spent: 25 minutes-Greater than 50% of this time was spent in counseling, explanation of diagnosis, planning of further management, and coordination of care.  MEDICATIONS: Scheduled Meds: . baclofen  20 mg Oral TID  . enoxaparin (LOVENOX) injection  30 mg Subcutaneous Q24H  . ferrous sulfate  325 mg Oral q morning -  10a  . FLUoxetine  40 mg Oral Daily  . gabapentin  300 mg Oral TID  . magnesium oxide  800 mg Oral BID  . pantoprazole (PROTONIX) IV  40 mg Intravenous Q12H  . potassium chloride  20 mEq Oral Daily  . sucralfate  1 g Oral TID AC & HS  . traZODone  150 mg Oral QHS  . zinc oxide  1 application Topical BID   Continuous Infusions: . sodium chloride 75 mL/hr at 03/17/17 1126  .  ampicillin-sulbactam (UNASYN) IV 1.5 g (03/18/17 0956)   PRN Meds:.acetaminophen, albuterol, benzonatate, bisacodyl, guaiFENesin-dextromethorphan, lactulose, LORazepam, LORazepam, polyethylene glycol, RESOURCE THICKENUP CLEAR, traZODone   PHYSICAL EXAM: Vital signs: Vitals:   03/17/17 2137 03/18/17 0505 03/18/17 0510 03/18/17 0845  BP: 140/90 123/72    Pulse: 98 94 98   Resp: 18 17    Temp: 98.6 F (37 C) 98.7 F (37.1 C)    TempSrc: Oral Oral    SpO2:   90% 90%  Weight:      Height:       Filed Weights   03/16/17 2349  Weight: 29.9 kg (66 lb)   Body mass index is 14.28 kg/m.   General appearance: Awake-has spastic quadriparesis-extensive muscle atrophy. But not in any distress this. Occasionally grimaces. HEENT: Atraumatic and Normocephalic Neck: supple Resp:Good air entry bilaterally-transmitted upper airway sounds CVS: S1 S2 regular GI: Bowel sounds present, Non tender Extremities: Extensive atrophy in all of his 4 limbs  I have personally reviewed following labs and imaging studies  LABORATORY DATA: CBC:  Recent Labs Lab 03/16/17 1801 03/18/17 0529  WBC 6.7 4.8  NEUTROABS 5.6  --   HGB 13.1 13.2  HCT 40.9 41.5  MCV 95.1 95.0  PLT 201 187    Basic Metabolic Panel:  Recent Labs Lab 03/16/17 1801 03/18/17 0529  NA 139 142  K 3.3* 3.6  CL 107 106  CO2 25 26  GLUCOSE 107* 74  BUN 11 10  CREATININE 0.35* 0.30*  CALCIUM 9.1 9.1    GFR: Estimated Creatinine Clearance: 48.8 mL/min (A) (by C-G formula based on SCr of 0.3 mg/dL (L)).  Liver Function Tests:  Recent Labs Lab 03/16/17 1801  AST 27  ALT 28  ALKPHOS 100  BILITOT 0.6  PROT 6.1*  ALBUMIN 3.2*   No results for input(s): LIPASE, AMYLASE in the last 168 hours. No results for input(s): AMMONIA in the last 168 hours.  Coagulation Profile: No results for input(s): INR, PROTIME in the last 168 hours.  Cardiac Enzymes: No results for input(s): CKTOTAL, CKMB, CKMBINDEX, TROPONINI in  the last 168 hours.  BNP (last 3 results) No results for input(s): PROBNP in the last 8760 hours.  HbA1C: No results for input(s): HGBA1C in the last 72 hours.  CBG: No results for input(s): GLUCAP in the last 168 hours.  Lipid Profile: No results for input(s): CHOL, HDL, LDLCALC, TRIG, CHOLHDL, LDLDIRECT in the last 72 hours.  Thyroid Function Tests: No results for input(s): TSH, T4TOTAL, FREET4, T3FREE, THYROIDAB in the last 72 hours.  Anemia Panel: No results for input(s): VITAMINB12, FOLATE, FERRITIN, TIBC, IRON, RETICCTPCT in the last 72 hours.  Urine analysis:    Component Value Date/Time   COLORURINE YELLOW 01/28/2016 1036   APPEARANCEUR CLEAR 01/28/2016 1036   LABSPEC 1.020 01/28/2016 1036   PHURINE >9.0 (H) 01/28/2016 1036   GLUCOSEU NEGATIVE 01/28/2016 1036   HGBUR NEGATIVE 01/28/2016 1036   BILIRUBINUR NEGATIVE 01/28/2016 1036   KETONESUR NEGATIVE 01/28/2016  1036   PROTEINUR NEGATIVE 01/28/2016 1036   NITRITE NEGATIVE 01/28/2016 1036   LEUKOCYTESUR NEGATIVE 01/28/2016 1036    Sepsis Labs: Lactic Acid, Venous    Component Value Date/Time   LATICACIDVEN 1.48 01/28/2016 1022    MICROBIOLOGY: Recent Results (from the past 240 hour(s))  Culture, blood (Routine X 2) w Reflex to ID Panel     Status: None (Preliminary result)   Collection Time: 03/16/17  6:01 PM  Result Value Ref Range Status   Specimen Description BLOOD BLOOD RIGHT FOREARM  Final   Special Requests   Final    BOTTLES DRAWN AEROBIC AND ANAEROBIC Blood Culture adequate volume   Culture NO GROWTH < 24 HOURS  Final   Report Status PENDING  Incomplete    RADIOLOGY STUDIES/RESULTS: Dg Chest 2 View  Result Date: 03/16/2017 CLINICAL DATA:  Shortness of breath.  Cerebral palsy. EXAM: CHEST  2 VIEW COMPARISON:  01/28/2016 FINDINGS: Technically suboptimal exam due to patient's physical disability and atypical positioning. Asymmetric opacity is seen in the right upper lobe, suspicious for pneumonia.  Left lung remains clear. No evidence of pneumothorax or pleural effusion. Gaseous distention of colon noted. IMPRESSION: Technically suboptimal exam. Asymmetric right upper lobe opacity, suspicious for pneumonia. Recommend clinical correlation, and continued chest radiographic follow-up to confirm resolution. Electronically Signed   By: Myles Rosenthal M.D.   On: 03/16/2017 20:38     LOS: 1 day   Jeoffrey Massed, MD  [facility] Pager:[phone]  If 7PM-7AM, please contact night-coverage [email or web address] Password [email or web address] 03/18/2017, 10:40 AM

## 2017-03-18 NOTE — Progress Notes (Signed)
Nutrition Brief Note  Chart reviewed. Palliative care team following pt; he is currently DNR and family has accepted risk for recurrent aspiration and desire for pt to eat for comfort. They do not desire a feeding tube, Noted pt is currently on a dysphagia 2 diet with nectar thick liquids. No further nutrition interventions warranted at this time.  Please re-consult as needed.   Mazin Emma A. Mayford KnifeWilliams, RD, LDN, CDE Pager: [phone] After hours Pager: [phone]

## 2017-03-18 NOTE — Care Management Note (Addendum)
Case Management Note  Patient Details  Name: Malik Webb MRN: [id number] Date of Birth: August 12, 1971  Subjective/Objective:          Admitted with PNA, hx of spastic quadriplegic cerebral palsy   lives with an  [facility]  provider, Water quality scientistCierra (caregiver).  Samul Dadaierra White (Friend) Loetta Roughngela Edgin (Legal [phone])    [phone]     PCP: Charlaine DaltonMichael B. Albertina ParrShipley   Action/Plan: Discharge planning in process.... CM following for disposition needs.    Expected Discharge Date:                  Expected Discharge Plan:    In-House Referral:  Clinical Social Work  Discharge planning Services  CM Consult  Post Acute Care Choice:    Choice offered to:     DME Arranged:   [facility] Agency:    [facility], [facility]  HH Arranged:    HH Agency:     Status of Service:  In process, will continue to follow  If discussed at Long Length of Stay Meetings, dates discussed:    Additional Comments: 03/19/2017 @ 10:29 CM received consult: As patient is near end of life can he have hospice services at home in addition to his current Medicaid Nursing assistant that he lives with. Pt's medicare should cover hospice services and pt's medicaid should continue to cover PCS. CM spoke with legal guardian and caregiver @ bedside and made them aware.    Gae GallopCole, Georges Victorio Lowry CrossingHudson, RN 03/18/2017, 8:48 PM

## 2017-03-18 NOTE — Progress Notes (Signed)
  Speech Language Pathology Treatment: Dysphagia  Patient Details Name: Malik Webb MRN: [id number] DOB: 08-Sep-1971 Today's Date: 03/18/2017 Time: 5374-8270 SLP Time Calculation (min) (ACUTE ONLY): 23 min  Assessment / Plan / Recommendation Clinical Impression  Pt up in chair with caregiver present. Completed chest pt vest with RT. Weak reflexive and ineffective cough; RT NTS suctioned and ST orally suctioned. Caregiver report pt gets thinner than nectar thickened water with syringe ("spoon takes too long") and puree with spoon. "He mainly drinks water." Caregiver reports syringe is placed in the cheek/side of tongue. SLP thickened grape juice which caregiver administered with spoon. SLP educated advised that spoon is safer however if syringe is used, give small amounts, in cheek so volume does not overwhelm pt. Suspect delayed swallow initiation, audible swallow present and weak delayed throat clears with suspected intermittent penetaration and/or aspiration. Palliative care met with family and stated they understand risk and accept these. SLP ordered Dys 1, nectar thick, small amounts, make sure pt has swallowed, slow pace. Goals met for family education. ST to sign off.    HPI HPI: Patient is a 46 y.o.malewith history of spastic quadriplegic cerebral palsy mostly bed to wheelchair bound-and dependent on caregivers for 24/7 care including all activities of daily living-on a pure diet at home, admitted with several days history of cough, shortness of breath, found to have pneumonia and admitted to the hospitalist service for further evaluation and treatment. MD notes probable aspiration pneumonia, pooling of secretions, requiring deep suctioning, weak cough reflex. Followed at [facility], prior aspiration pneumonitis, admission 12/12/14 with upper GI hemorrhage, erosive esophagitis, Barrett's esophagus was discharged on pureed diet with nectar-thick liquids, with strict aspiration precautions. [place] notes  indicate pt inappropriate for instrumental evaluations given severe cognitive impairment, physical challenges. Noted high aspiration risk, recommended nectar-thick liquids and puree via syringe.       SLP Plan  All goals met;Discharge SLP treatment due to (comment)       Recommendations  Diet recommendations: Dysphagia 1 (puree);Nectar-thick liquid Liquids provided via: Teaspoon Medication Administration: Crushed with puree Supervision: Full supervision/cueing for compensatory strategies;Staff to assist with self feeding Compensations: Minimize environmental distractions;Slow rate;Small sips/bites Postural Changes and/or Swallow Maneuvers: Seated upright 90 degrees;Upright 30-60 min after meal                Oral Care Recommendations: Oral care BID Follow up Recommendations: None SLP Visit Diagnosis: Dysphagia, unspecified (R13.10) Plan: All goals met;Discharge SLP treatment due to (comment)       GO                Houston Siren 03/18/2017, 9:21 AM  Orbie Pyo Colvin Caroli.[employer] [phone]

## 2017-03-18 NOTE — Progress Notes (Signed)
Called by RN to patient's room to speak with family.  Patient is gurgly and has wet breathing sounds with difficulty coughing up his secretions.  Brief examination reveals a 46 yo male with end stage cerebral palsy and no reserves.  Had a private conversation with his guardian Malik Webb and one of his care takers.  We discussed aspiration pneumonia and end of life.  She asked for prognosis and I told her it may be weeks.  We discussed making has time as good as possible for as long as possible.  I offered to start low dose morphine for dyspnea and Malik Webb strongly supported the idea.  She also suggested a benzodiazepine as he has been on ativan in the past and done well.  Malik LandAngela and I discussed Hospice services at home.  I have placed a case management order to see if he can have Hospice services in addition to the medicaid services he is currently receiving.  (The family does not want to lose [place]).  We also talked about hospice house at end of life.  Hard choices books were given.  Emotional support provided.  I spoke with the RN about starting prn morphine / lorazepam.  Briefly discussed with Dr. Reece AgarG as well.  Time 35 min.  Malik DownsMarianne Webb, [place] JerseyPA-C Palliative Medicine Pager: [phone]

## 2017-03-19 MED ORDER — HYDROMORPHONE HCL 1 MG/ML IJ SOLN: 1.0000 mg | INTRAMUSCULAR | Status: DC | PRN

## 2017-03-19 MED ORDER — MORPHINE SULFATE (CONCENTRATE) 10 MG/0.5ML PO SOLN
5.0000 mg | ORAL | Status: DC
  Administered 2017-03-19: 5 mg via SUBLINGUAL
  Filled 2017-03-19: qty 0.5

## 2017-03-19 MED ORDER — HALOPERIDOL LACTATE 2 MG/ML PO CONC: 1.0000 mg | ORAL | Status: DC | PRN

## 2017-03-19 MED ORDER — HALOPERIDOL 0.5 MG PO TABS
0.5000 mg | ORAL_TABLET | ORAL | Status: DC | PRN
  Filled 2017-03-19: qty 1

## 2017-03-19 MED ORDER — PANTOPRAZOLE SODIUM 40 MG PO PACK
40.0000 mg | PACK | Freq: Every day | ORAL | Status: DC
  Administered 2017-03-19 – 2017-03-20 (×2): 40 mg via ORAL
  Filled 2017-03-19: qty 20

## 2017-03-19 MED ORDER — MORPHINE SULFATE (CONCENTRATE) 10 MG/0.5ML PO SOLN
15.0000 mg | ORAL | Status: DC
  Administered 2017-03-19 – 2017-03-20 (×4): 15 mg via SUBLINGUAL
  Filled 2017-03-19 (×4): qty 1

## 2017-03-19 MED ORDER — GLYCOPYRROLATE 0.2 MG/ML IJ SOLN: 0.2000 mg | INTRAMUSCULAR | Status: DC | PRN

## 2017-03-19 MED ORDER — HYDROMORPHONE HCL 1 MG/ML IJ SOLN: 0.5000 mg | Freq: Once | INTRAMUSCULAR | Status: DC

## 2017-03-19 MED ORDER — HALOPERIDOL LACTATE 5 MG/ML IJ SOLN
1.0000 mg | INTRAMUSCULAR | Status: DC | PRN
  Administered 2017-03-19 – 2017-03-20 (×3): 1 mg via INTRAVENOUS
  Filled 2017-03-19 (×3): qty 1

## 2017-03-19 MED ORDER — HYDROMORPHONE HCL 1 MG/ML IJ SOLN
0.5000 mg | INTRAMUSCULAR | Status: DC | PRN
  Administered 2017-03-19 (×2): 0.5 mg via INTRAVENOUS
  Filled 2017-03-19 (×2): qty 0.5

## 2017-03-19 MED ORDER — HALOPERIDOL LACTATE 2 MG/ML PO CONC
0.5000 mg | ORAL | Status: DC | PRN
Start: 2017-03-19 — End: 2017-03-19
  Filled 2017-03-19: qty 0.3

## 2017-03-19 MED ORDER — MORPHINE SULFATE (CONCENTRATE) 10 MG/0.5ML PO SOLN
10.0000 mg | ORAL | Status: DC
  Administered 2017-03-19: 10 mg via SUBLINGUAL
  Filled 2017-03-19: qty 0.5

## 2017-03-19 MED ORDER — BIOTENE DRY MOUTH MT LIQD: 15.0000 mL | OROMUCOSAL | Status: DC | PRN

## 2017-03-19 MED ORDER — POLYVINYL ALCOHOL 1.4 % OP SOLN
1.0000 [drp] | Freq: Four times a day (QID) | OPHTHALMIC | Status: DC | PRN
  Filled 2017-03-19: qty 15

## 2017-03-19 MED ORDER — ONDANSETRON 4 MG PO TBDP: 4.0000 mg | ORAL_TABLET | Freq: Four times a day (QID) | ORAL | Status: DC | PRN

## 2017-03-19 MED ORDER — GLYCOPYRROLATE 1 MG PO TABS: 1.0000 mg | ORAL_TABLET | ORAL | Status: DC | PRN

## 2017-03-19 MED ORDER — HYDROMORPHONE HCL 1 MG/ML IJ SOLN
1.0000 mg | INTRAMUSCULAR | Status: DC | PRN
  Administered 2017-03-19 – 2017-03-20 (×4): 1 mg via INTRAVENOUS
  Filled 2017-03-19 (×4): qty 1

## 2017-03-19 MED ORDER — ACETAMINOPHEN 650 MG RE SUPP: 650.0000 mg | Freq: Four times a day (QID) | RECTAL | Status: DC | PRN

## 2017-03-19 MED ORDER — LORAZEPAM 2 MG/ML IJ SOLN: 1.0000 mg | INTRAMUSCULAR | Status: DC | PRN

## 2017-03-19 MED ORDER — GLYCOPYRROLATE 0.2 MG/ML IJ SOLN
0.2000 mg | Freq: Three times a day (TID) | INTRAMUSCULAR | Status: DC
  Administered 2017-03-19 (×3): 0.2 mg via INTRAVENOUS
  Filled 2017-03-19 (×3): qty 1

## 2017-03-19 MED ORDER — HALOPERIDOL LACTATE 5 MG/ML IJ SOLN
0.5000 mg | INTRAMUSCULAR | Status: DC | PRN
  Administered 2017-03-19: 0.5 mg via INTRAVENOUS
  Filled 2017-03-19: qty 1

## 2017-03-19 MED ORDER — ONDANSETRON HCL 4 MG/2ML IJ SOLN: 4.0000 mg | Freq: Four times a day (QID) | INTRAMUSCULAR | Status: DC | PRN

## 2017-03-19 MED ORDER — ACETAMINOPHEN 325 MG PO TABS: 650.0000 mg | ORAL_TABLET | Freq: Four times a day (QID) | ORAL | Status: DC | PRN

## 2017-03-19 NOTE — Progress Notes (Signed)
Palliative Medicine RN Note: Afternoon symptom check. Pt has gotten scheduled morphine and got prn dilaudid at about 1030, so can have next dose at 1430. Pt's CG, sister, and other friends/family at bedside. With help from CeErra, elicited from pt that he is in pain. She reports that every time he is starting to relax, he jerks awake; she feels that pain is not adequately controlled. Discussed pt with RN; she will bring prn dose of hydromorphone asap.  Spoke with NP Murrell ConverseSarah Dunn. She is going to adjust his regimen, and PMT member will follow up in the morning.  Margret ChanceMelanie G. Deriona Altemose, RN, BSN, [facility] 03/19/2017 3:08 PM Cell [phone] 8:00-4:00 Monday-Friday Office [phone]

## 2017-03-19 NOTE — Progress Notes (Signed)
[facility] Liaison Note:  Received a referral from Levada Dy [facility] of family interest in home with hospice services upon hospital discharge.  Met with Angie, guardian and CeAerra, [facility] to discuss hospice philosophy and team approach to care.  Patient information reviewed with hospice medical director, Dr. Alferd Patee, and patient determined to be hospice eligible.  Per family request, patient's insurance is currently under review to see how current medicaid benefits could be potentially impacted by electing hospice benefit.  Patient currently receives 24 hour care support, through the innovations waiver, according to family.  [facility] insurance specialist is currently reviewing the patient's benefits.  [facility] will keep family and Levada Dy, [facility] updated.  Left contact information with Angie if any-hospice-related questions or concerns arise.  Thank you, Freddi Starr RN, [facility] Liaison [phone]

## 2017-03-19 NOTE — Progress Notes (Signed)
PROGRESS NOTE        PATIENT DETAILS Name: Malik Webb Age: 46 y.o. Sex: male Date of Birth: 04/28/1971 Admit Date: 03/16/2017 Admitting Physician Hillary Bow, DO ZOX:[id number], Charlaine Dalton, MD  Brief Narrative: Patient is a 46 y.o. male with history of spastic quadriplegic cerebral palsy mostly bed to wheelchair bound-and dependent on caregivers for 24/7 care including all activities of daily living-on a pure diet at home, admitted with several days history of cough, shortness of breath, thought to be due to aspiration pneumonia. He was admitted and started on empiric antimicrobial therapy-unfortunately continues to have overt aspiration in spite of maximal therapy and speech therapy evaluation. After extensive discussion with family, he has now been transitioned to comfort measures-plans are to continue inpatient hospitalization for another day or so for better symptom control, before considering discharge home with hospice care.   Subjective: "Gurgling"-per caregiver at bedside-of night-very agitated  Assessment/Plan: Aspiration pneumonia: Unfortunately in spite of antimicrobial therapy, mucolytic's, chest physiotherapy-he continues to overtly aspirate. Family accepted risks, and was started on a dysphagia 1 diet-but due to overt aspiration- we may need to minimize oral intake and allow for only comfort/pleasure feedings. Since he continues to worsen-after discussion with patient's sister over the phone by this M.D.-we have transitioned to mostly comfort measures-I have stopped antibiotics and other medications. Palliative care in the process of optimizing symptom control before consideration of discharge back to his caregiver with hospice care.DNR in place  History of spastic quadriplegic cerebral palsy: Bed to wheelchair bound-on a pure diet-dependent on caregiver for all ADLs. Overtly aspirating-a very poor prognosis-now on mostly comfort measures.  History of  GERD/Barrett's esophagitis: Stop PPI/Carafate-comfort measures  History of chronic systolic heart failure: Unknown EF-reviewed notes and caring everywhere-patient follows at [facility] status is stable  Stage II sacral decubitus: Appreciate wound care evaluation  Morning labs/Imaging ordered: no  DVT Prophylaxis: Prophylactic Lovenox   Code Status: DNR  Family Communication: Caregiver at bedside this morning Sister over the phone  Disposition Plan: Remain inpatient-probably back to caregiver with home hospice care  Antimicrobial agents: Anti-infectives    Start     Dose/Rate Route Frequency Ordered Stop   03/18/17 0845  ampicillin-sulbactam (UNASYN) 1.5 g in sodium chloride 0.9 % 50 mL IVPB  Status:  Discontinued     1.5 g 100 mL/hr over 30 Minutes Intravenous Every 8 hours 03/18/17 0832 03/19/17 0912   03/17/17 2200  vancomycin (VANCOCIN) 500 mg in sodium chloride 0.9 % 100 mL IVPB  Status:  Discontinued     500 mg 100 mL/hr over 60 Minutes Intravenous Every 24 hours 03/17/17 0158 03/18/17 0824   03/17/17 0600  aztreonam (AZACTAM) 1 g in dextrose 5 % 50 mL IVPB  Status:  Discontinued     1 g 100 mL/hr over 30 Minutes Intravenous Every 8 hours 03/17/17 0158 03/18/17 0824   03/16/17 2100  aztreonam (AZACTAM) 2 g in dextrose 5 % 50 mL IVPB     2 g 100 mL/hr over 30 Minutes Intravenous  Once 03/16/17 2049 03/16/17 2301   03/16/17 2100  vancomycin (VANCOCIN) IVPB 1000 mg/200 mL premix     1,000 mg 200 mL/hr over 60 Minutes Intravenous  Once 03/16/17 2049 03/17/17 0003      Procedures: None  CONSULTS: Palliative  Time spent: 25 minutes-Greater than 50% of this time was spent  in counseling, explanation of diagnosis, planning of further management, and coordination of care.  MEDICATIONS: Scheduled Meds: . albuterol  2.5 mg Nebulization QID  . baclofen  20 mg Oral TID  . gabapentin  300 mg Oral TID  . glycopyrrolate  0.2 mg Intravenous TID  . morphine CONCENTRATE  10  mg Sublingual Q4H  . pantoprazole sodium  40 mg Oral Daily  . traZODone  150 mg Oral QHS  . zinc oxide  1 application Topical BID   Continuous Infusions: . sodium chloride 10 mL/hr at 03/19/17 0602   PRN Meds:.antiseptic oral rinse, [DISCONTINUED] haloperidol **OR** haloperidol **OR** haloperidol lactate, HYDROmorphone (DILAUDID) injection, ondansetron **OR** ondansetron (ZOFRAN) IV, polyethylene glycol, polyvinyl alcohol, RESOURCE THICKENUP CLEAR   PHYSICAL EXAM: Vital signs: Vitals:   03/18/17 2111 03/18/17 2112 03/19/17 0553 03/19/17 0903  BP:  135/78 133/77   Pulse: (!) 107 (!) 107 (!) 103   Resp: 20 18 (!) 26   Temp:  99.4 F (37.4 C) 98.4 F (36.9 C)   TempSrc:  Oral Oral   SpO2: 91% 93% 94% 97%  Weight:      Height:       Filed Weights   03/16/17 2349  Weight: 29.9 kg (66 lb)   Body mass index is 14.28 kg/m.   General appearance:In some amount of distress due to overt aspiration-looks very frail-has extensive muscle atrophy and spastic quadriparesis HEENT: Atraumatic and Normocephalic Neck: supple Resp: Moving air-but extensively transmitted upper airway sounds CVS: S1 S2 regular, no murmurs.  GI: Bowel sounds present, Non tender  Extremities: B/L Lower Ext shows no edema   I have personally reviewed following labs and imaging studies  LABORATORY DATA: CBC:  Recent Labs Lab 03/16/17 1801 03/18/17 0529  WBC 6.7 4.8  NEUTROABS 5.6  --   HGB 13.1 13.2  HCT 40.9 41.5  MCV 95.1 95.0  PLT 201 187    Basic Metabolic Panel:  Recent Labs Lab 03/16/17 1801 03/18/17 0529  NA 139 142  K 3.3* 3.6  CL 107 106  CO2 25 26  GLUCOSE 107* 74  BUN 11 10  CREATININE 0.35* 0.30*  CALCIUM 9.1 9.1    GFR: Estimated Creatinine Clearance: 48.8 mL/min (A) (by C-G formula based on SCr of 0.3 mg/dL (L)).  Liver Function Tests:  Recent Labs Lab 03/16/17 1801  AST 27  ALT 28  ALKPHOS 100  BILITOT 0.6  PROT 6.1*  ALBUMIN 3.2*   No results for input(s):  LIPASE, AMYLASE in the last 168 hours. No results for input(s): AMMONIA in the last 168 hours.  Coagulation Profile: No results for input(s): INR, PROTIME in the last 168 hours.  Cardiac Enzymes: No results for input(s): CKTOTAL, CKMB, CKMBINDEX, TROPONINI in the last 168 hours.  BNP (last 3 results) No results for input(s): PROBNP in the last 8760 hours.  HbA1C: No results for input(s): HGBA1C in the last 72 hours.  CBG: No results for input(s): GLUCAP in the last 168 hours.  Lipid Profile: No results for input(s): CHOL, HDL, LDLCALC, TRIG, CHOLHDL, LDLDIRECT in the last 72 hours.  Thyroid Function Tests: No results for input(s): TSH, T4TOTAL, FREET4, T3FREE, THYROIDAB in the last 72 hours.  Anemia Panel: No results for input(s): VITAMINB12, FOLATE, FERRITIN, TIBC, IRON, RETICCTPCT in the last 72 hours.  Urine analysis:    Component Value Date/Time   COLORURINE YELLOW 01/28/2016 1036   APPEARANCEUR CLEAR 01/28/2016 1036   LABSPEC 1.020 01/28/2016 1036   PHURINE >9.0 (H) 01/28/2016 1036  GLUCOSEU NEGATIVE 01/28/2016 1036   HGBUR NEGATIVE 01/28/2016 1036   BILIRUBINUR NEGATIVE 01/28/2016 1036   KETONESUR NEGATIVE 01/28/2016 1036   PROTEINUR NEGATIVE 01/28/2016 1036   NITRITE NEGATIVE 01/28/2016 1036   LEUKOCYTESUR NEGATIVE 01/28/2016 1036    Sepsis Labs: Lactic Acid, Venous    Component Value Date/Time   LATICACIDVEN 1.48 01/28/2016 1022    MICROBIOLOGY: Recent Results (from the past 240 hour(s))  Culture, blood (Routine X 2) w Reflex to ID Panel     Status: None (Preliminary result)   Collection Time: 03/16/17  6:01 PM  Result Value Ref Range Status   Specimen Description BLOOD BLOOD RIGHT FOREARM  Final   Special Requests   Final    BOTTLES DRAWN AEROBIC AND ANAEROBIC Blood Culture adequate volume   Culture NO GROWTH 2 DAYS  Final   Report Status PENDING  Incomplete    RADIOLOGY STUDIES/RESULTS: Dg Chest 2 View  Result Date: 03/16/2017 CLINICAL DATA:   Shortness of breath.  Cerebral palsy. EXAM: CHEST  2 VIEW COMPARISON:  01/28/2016 FINDINGS: Technically suboptimal exam due to patient's physical disability and atypical positioning. Asymmetric opacity is seen in the right upper lobe, suspicious for pneumonia. Left lung remains clear. No evidence of pneumothorax or pleural effusion. Gaseous distention of colon noted. IMPRESSION: Technically suboptimal exam. Asymmetric right upper lobe opacity, suspicious for pneumonia. Recommend clinical correlation, and continued chest radiographic follow-up to confirm resolution. Electronically Signed   By: Myles Rosenthal M.D.   On: 03/16/2017 20:38     LOS: 2 days   Jeoffrey Massed, MD  [facility] Pager:[phone]  If 7PM-7AM, please contact night-coverage [email or web address] Password [email or web address] 03/19/2017, 11:22 AM

## 2017-03-19 NOTE — Progress Notes (Signed)
Daily Progress Note   Patient Name: Malik Webb       Date: 03/19/2017 DOB: 1970/12/16  Age: 46 y.o. MRN#: [id number] Attending Physician: Malik Osgood, MD Primary Care Physician: Malik Devon, MD Admit Date: 03/16/2017  Reason for Consultation/Follow-up: Disposition, Non pain symptom management and Psychosocial/spiritual support  Subjective: Malik Webb is not able to verbally communicate his needs, however he demonstrates significant anxiety, restlessness, and discomfort. His caregiver is at bedside, and she believes these symptoms stem from increased secretion burden and being unable to clear them. He does seem comforted by her presence and relaxes as she talks with him.   Length of Stay: 2  Current Medications: Scheduled Meds:  . albuterol  2.5 mg Nebulization QID  . baclofen  20 mg Oral TID  . gabapentin  300 mg Oral TID  . glycopyrrolate  0.2 mg Intravenous TID  . morphine CONCENTRATE  5 mg Sublingual Q4H  . potassium chloride  20 mEq Oral Daily  . traZODone  150 mg Oral QHS  . zinc oxide  1 application Topical BID    Continuous Infusions: . sodium chloride 10 mL/hr at 03/19/17 0602    PRN Meds: antiseptic oral rinse, [DISCONTINUED] haloperidol **OR** haloperidol **OR** haloperidol lactate, HYDROmorphone (DILAUDID) injection, ondansetron **OR** ondansetron (ZOFRAN) IV, polyethylene glycol, polyvinyl alcohol, RESOURCE THICKENUP CLEAR  Physical Exam  Constitutional: He appears distressed.  HENT:  Head: Macrocephalic.  Mouth/Throat: Mucous membranes are pale. Abnormal dentition.  Eyes: EOM are normal.  Neck: Normal range of motion. Neck supple.  Cardiovascular: Regular rhythm.  Tachycardia present.   Pulmonary/Chest: Accessory muscle usage present. Tachypnea noted. He has rhonchi.  Weak rattling cough, audible  secretions but unable to clear.   Abdominal: Soft. Bowel sounds are normal.  Musculoskeletal:  Significant contractures and muscle wasting  Neurological: He is alert.  Responsive to voice, but not verbally communicative  Skin: Skin is warm and dry. There is pallor.  Psychiatric: He is agitated.            Vital Signs: BP 133/77 (BP Location: Right Leg)   Pulse (!) 103   Temp 98.4 F (36.9 C) (Oral)   Resp (!) 26   Ht 4' 9" (1.448 m)   Wt 29.9 kg (66 lb)   SpO2 97%   BMI 14.28 kg/m  SpO2: SpO2: 97 % O2 Device: O2 Device: Not Delivered O2 Flow Rate:    Intake/output summary:  Intake/Output Summary (Last 24 hours) at 03/19/17 0947 Last data filed at 03/19/17 0701  Gross per 24 hour  Intake              572 ml  Output              825 ml  Net             -253 ml   LBM: Last BM Date: 03/18/17 Baseline Weight: Weight: 29.9 kg (66 lb) Most recent weight: Weight: 29.9 kg (66 lb)       Palliative Assessment/Data: PPS 20%; chronic at baseline   Flowsheet Rows     Most Recent Value  Intake Tab  Referral Department  Hospitalist  Unit at Time of Referral  Med/Surg Unit  Palliative Care Primary Diagnosis  Sepsis/Infectious Disease  Date Notified  03/17/17  Palliative Care Type  New Palliative care  Reason for referral  Clarify Goals of Care  Date of Admission  03/16/17  Date first seen by Palliative Care  03/17/17  # of days Palliative referral response time  0 Day(s)  # of days IP prior to Palliative referral  1  Clinical Assessment  Palliative Performance Scale Score  20%  Pain Max last 24 hours  Not able to report  Pain Min Last 24 hours  Not able to report  Dyspnea Max Last 24 Hours  Not able to report  Dyspnea Min Last 24 hours  Not able to report  Nausea Max Last 24 Hours  Not able to report  Nausea Min Last 24 Hours  Not able to report  Anxiety Max Last 24 Hours  Not able to report  Anxiety Min Last 24 Hours  Not able to report  Other Max Last 24 Hours  Not  able to report  Psychosocial & Spiritual Assessment  Palliative Care Outcomes  Patient/Family meeting held?  Yes  Who was at the meeting?  caregiver, legal guardian  Palliative Care Outcomes  Clarified goals of care, Provided psychosocial or spiritual support, Changed CPR status, Completed durable DNR, Provided advance care planning  Patient/Family wishes: Interventions discontinued/not started   Mechanical Ventilation, Hemodialysis, Vasopressors, Trach, Tube feedings/TPN, PEG  Palliative Care follow-up planned  No      Patient Active Problem List   Diagnosis Date Noted  . Aspiration into airway   . Palliative care encounter   . Palliative care by specialist   . CP (cerebral palsy) ([place]) 03/16/2017  . Right upper lobe pneumonia ([place]) 03/16/2017  . Closed nondisplaced supracondylar fracture of lower end of left femur without intracondylar extension with routine healing 12/02/2016    Palliative Care Assessment & Plan   [name]  with past medical history of Cerebral palsy, spastic quadriplegic, stage II sacral decubitus, GERD/Barrett's esophagitis, MI admitted on 03/16/2017 with increased cough, shortness of breath. Patient was found to have a right upper lobe infiltrate. Most of patient's care has been delivered at [facility]. Patient resides in a group home, but primarily lives with his caregiver, Malik Webb. His sister, Malik Webb is his legal guardian.   Assessment: Palliative Care has met with the patient's legal guardian, his sister Malik Webb, and his primary caregiver Malik Webb. In that meeting they discussed prognosis given his multiple health issues, and explored the goals of care. His sister felt Hospice was appropriate, as keeping him comfortable was the priority. There was a question about insurance coverage for Hospice and his caregiver (paid for through [facility]); I spoke with the CM today and she does believe both services will be covered (medicare paying for Hospice, and  Medicaid covering the caregiver). At this point, the goal is to ensure effective symptom management on an oral regimen, then transition him home with Hospice with ongoing support from his full time caregiver. Of note, I did talk with Malik Webb about the respite services associated with Hospice, as well as the residential option if his symptom burden became too great to manage at home.   Recommendations/Plan:  DNR, comfort care, plan to transition home with Hospice once symptoms are under better control (w/ continuation of full time caregiver)  Symptoms:  -Dyspnea/respiratory strain: Schedule Morphine SL, PRN dilaudid IV -Secretions: Scheduled robinul -Anxiety: PRN Haldol SL/IV  Goals of Care and Additional Recommendations:  Limitations on  Scope of Treatment: Full Comfort Care  Code Status:  DNR  Prognosis:   < 4 weeks  Discharge Planning:  Home with Hospice  Care plan was discussed with pt's caregiver, care nurse, CM, primary team  Thank you for allowing the Palliative Medicine Team to assist in the care of this patient.  Time in: 0920 Time out: 1000 Total time: 75 minutes    Greater than 50%  of this time was spent counseling and coordinating care related to the above assessment and plan.  Charlynn Court, NP Palliative Medicine Team [phone] pager (7a-5p) Team Phone # [phone]

## 2017-03-19 NOTE — Progress Notes (Signed)
CM received consult for home with hospice care. CM @ bedside spoke with Marylene Landngela( guardian) and Customer service managerCearra ( caregiver) regarding home hospice. Choice provided. Marylene LandAngela selected [facility] for home hospice referral. CM made referral with Stacey/[facility] @ [phone]. Misty StanleyStacey stated will  come see pt 30-45 min. CM made Tonny BranchAngela and Cearra aware. Gae GallopAngela Taven Strite RN,BSN,CM

## 2017-03-20 DIAGNOSIS — G809 Cerebral palsy, unspecified: Secondary | ICD-10-CM

## 2017-03-20 DIAGNOSIS — L8990 Pressure ulcer of unspecified site, unspecified stage: Secondary | ICD-10-CM | POA: Insufficient documentation

## 2017-03-20 DIAGNOSIS — T17908A Unspecified foreign body in respiratory tract, part unspecified causing other injury, initial encounter: Secondary | ICD-10-CM

## 2017-03-20 DIAGNOSIS — Z515 Encounter for palliative care: Secondary | ICD-10-CM

## 2017-03-20 DIAGNOSIS — J189 Pneumonia, unspecified organism: Secondary | ICD-10-CM

## 2017-03-20 MED ORDER — SCOPOLAMINE 1 MG/3DAYS TD PT72
1.0000 | MEDICATED_PATCH | TRANSDERMAL | Status: DC
  Administered 2017-03-20: 1.5 mg via TRANSDERMAL
  Filled 2017-03-20: qty 1

## 2017-03-20 MED ORDER — GABAPENTIN 250 MG/5ML PO SOLN
300.0000 mg | Freq: Three times a day (TID) | ORAL | Status: DC
  Administered 2017-03-20 – 2017-03-21 (×2): 300 mg via ORAL
  Filled 2017-03-20 (×5): qty 6

## 2017-03-20 MED ORDER — HALOPERIDOL LACTATE 2 MG/ML PO CONC
2.0000 mg | ORAL | Status: DC | PRN
  Filled 2017-03-20: qty 1

## 2017-03-20 MED ORDER — HALOPERIDOL LACTATE 5 MG/ML IJ SOLN
1.0000 mg | INTRAMUSCULAR | Status: DC | PRN
  Administered 2017-03-21 (×3): 1 mg via INTRAVENOUS
  Filled 2017-03-20 (×4): qty 1

## 2017-03-20 MED ORDER — GLYCOPYRROLATE 0.2 MG/ML IJ SOLN
0.2000 mg | Freq: Once | INTRAMUSCULAR | Status: AC
  Administered 2017-03-20: 0.2 mg via ORAL
  Filled 2017-03-20: qty 1

## 2017-03-20 MED ORDER — BACLOFEN 1 MG/ML ORAL SUSPENSION
20.0000 mg | Freq: Three times a day (TID) | ORAL | Status: DC
  Administered 2017-03-20 – 2017-03-21 (×2): 20 mg via ORAL
  Filled 2017-03-20 (×5): qty 2

## 2017-03-20 MED ORDER — GLYCOPYRROLATE 0.2 MG/ML IJ SOLN
0.2000 mg | Freq: Three times a day (TID) | INTRAMUSCULAR | Status: DC
  Administered 2017-03-20 – 2017-03-21 (×3): 0.2 mg via INTRAVENOUS
  Filled 2017-03-20 (×3): qty 1

## 2017-03-20 MED ORDER — HYDROMORPHONE HCL 1 MG/ML IJ SOLN
0.5000 mg | INTRAMUSCULAR | Status: DC | PRN
  Filled 2017-03-20: qty 1

## 2017-03-20 MED ORDER — MORPHINE SULFATE (CONCENTRATE) 10 MG/0.5ML PO SOLN
20.0000 mg | ORAL | Status: DC
  Administered 2017-03-20 – 2017-03-21 (×7): 20 mg via SUBLINGUAL
  Filled 2017-03-20 (×7): qty 1

## 2017-03-20 MED ORDER — OLANZAPINE 5 MG PO TBDP: 5.0000 mg | ORAL_TABLET | Freq: Every day | ORAL | Status: DC

## 2017-03-20 NOTE — Progress Notes (Addendum)
Daily Progress Note   Patient Name: Malik Webb       Date: 03/20/2017 DOB: Feb 28, 1971  Age: 46 y.o. MRN#: [id number] Attending Physician: Mendel Corning, MD Primary Care Physician: Andi Devon, MD Admit Date: 03/16/2017  Reason for Consultation/Follow-up: Disposition, Non pain symptom management and Psychosocial/spiritual support  Subjective: Malik Webb is not able to verbally communicate his needs. I spoke with his caregiver at his bedside, she relates that he had a very restless night with frequent period of acute anxiety. This morning, he is certainly more calm compared to yesterday, though does not appear as comfortable as he should be. Secretion burden has improved, though he appears more contracted and continues to have periods of crying out.   Length of Stay: 3  Current Medications: Scheduled Meds:  . baclofen  20 mg Oral TID  . gabapentin  300 mg Oral TID  . morphine CONCENTRATE  20 mg Sublingual Q4H  . OLANZapine zydis  5 mg Oral QHS  . pantoprazole sodium  40 mg Oral Daily  . scopolamine  1 patch Transdermal Q72H  . zinc oxide  1 application Topical BID    Continuous Infusions: . sodium chloride 10 mL/hr at 03/19/17 0602    PRN Meds: antiseptic oral rinse, [DISCONTINUED] haloperidol **OR** haloperidol **OR** [DISCONTINUED] haloperidol lactate, HYDROmorphone (DILAUDID) injection, ondansetron **OR** [DISCONTINUED] ondansetron (ZOFRAN) IV, polyvinyl alcohol, RESOURCE THICKENUP CLEAR  Physical Exam  Constitutional: He appears distressed (better compared to 6/5, though still clearly anxious and restless).  HENT:  Head: Macrocephalic.  Mouth/Throat: Mucous membranes are pale. Abnormal dentition.  Less audible secretions  Eyes: EOM are normal.  Neck: Normal range of motion. Neck supple.  Cardiovascular: Regular rhythm.   Tachycardia present.   Pulmonary/Chest: Accessory muscle usage present. Tachypnea noted. He has rhonchi.  Weak rattling cough  Abdominal: Soft. Bowel sounds are normal.  Musculoskeletal:  Significant contractures and muscle wasting  Neurological: He is alert.  Responsive to voice, but not verbally communicative  Skin: Skin is warm and dry. There is pallor.  Psychiatric: He is agitated.           Vital Signs: BP 135/88 (BP Location: Left Leg)   Pulse 92   Temp 98.4 F (36.9 C) (Oral)   Resp 18   Ht _0  (1.448 m)   Wt 29.9 kg (66 lb)   SpO2 96%   BMI 14.28 kg/m  SpO2: SpO2: 96 % O2 Device: O2 Device: Not Delivered O2 Flow Rate:    Intake/output summary:   Intake/Output Summary (Last 24 hours) at 03/20/17 0942 Last data filed at 03/20/17 9798  Gross per 24 hour  Intake              220 ml  Output              220 ml  Net                0 ml   LBM: Last BM Date: 03/18/17 Baseline Weight: Weight: 29.9 kg (66 lb) Most recent weight: Weight: 29.9 kg (66 lb)  Palliative Assessment/Data: PPS 20%; chronic at baseline   Flowsheet Rows     Most Recent Value  Intake Tab  Referral Department  Hospitalist  Unit at Time of Referral  Med/Surg Unit  Palliative Care Primary Diagnosis  Sepsis/Infectious Disease  Date Notified  03/17/17  Palliative Care Type  New Palliative care  Reason for referral  Clarify Goals of Care  Date of Admission  03/16/17  Date first seen by Palliative Care  03/17/17  # of days Palliative referral response time  0 Day(s)  # of days IP prior to Palliative referral  1  Clinical Assessment  Palliative Performance Scale Score  20%  Pain Max last 24 hours  Not able to report  Pain Min Last 24 hours  Not able to report  Dyspnea Max Last 24 Hours  Not able to report  Dyspnea Min Last 24 hours  Not able to report  Nausea Max Last 24 Hours  Not able to report  Nausea Min Last 24 Hours  Not able to report  Anxiety Max Last 24 Hours  Not able to report    Anxiety Min Last 24 Hours  Not able to report  Other Max Last 24 Hours  Not able to report  Psychosocial & Spiritual Assessment  Palliative Care Outcomes  Patient/Family meeting held?  Yes  Who was at the meeting?  caregiver, legal guardian  Palliative Care Outcomes  Clarified goals of care, Provided psychosocial or spiritual support, Changed CPR status, Completed durable DNR, Provided advance care planning  Patient/Family wishes: Interventions discontinued/not started   Mechanical Ventilation, Hemodialysis, Vasopressors, Trach, Tube feedings/TPN, PEG  Palliative Care follow-up planned  No      Patient Active Problem List   Diagnosis Date Noted  . Aspiration into airway   . Palliative care encounter   . Palliative care by specialist   . CP (cerebral palsy) ([place]) 03/16/2017  . Right upper lobe pneumonia ([place]) 03/16/2017  . Closed nondisplaced supracondylar fracture of lower end of left femur without intracondylar extension with routine healing 12/02/2016    Palliative Care Assessment & Plan   [name]  with past medical history of Cerebral palsy, spastic quadriplegic, stage II sacral decubitus, GERD/Barrett's esophagitis, MI admitted on 03/16/2017 with increased cough, shortness of breath. Patient was found to have a right upper lobe infiltrate. Most of patient's [facility] has [facility]. Patient resides in a group home, but primarily lives with his caregiver, Malik Webb. His sister, Malik Webb is his legal guardian.   Assessment: Palliative Care has met with the patient's legal guardian, his sister Malik Webb, and his primary caregiver Malik Webb. In that meeting they discussed prognosis given his multiple health issues, and explored the goals of care. His sister felt Hospice was appropriate, as keeping him comfortable was the priority. At this point, the goal is to ensure effective symptom management on an oral regimen, then transition him out of the hospital. [facility]  evaluated yesterday, they are running the insurance information to clarify that he can keep his full time caregiver and receive Hospice services.   Today, Malik Webb continues to have significant anxiety and restlessness. He does appear better compared to yesterday, but is not yet in a place with good symptom management. As the goal is to have him at home, I have been working to change his medications to an oral route, and titrate medication based on his symptoms. I discussed the medications, timing, and changes at length with his caregiver, Malik Webb. The patient's sister/guardian is very concerned about oral administration of pills, as he seems to struggle with even the suspended powder  and small pill pieces. On her request, I have converted the necessary medication to a suspension form, which he has done better taking.   1300 Addendum: I was called back to the room to re-evaluate Mr. Katt at his caregiver's request. On my arrival to the room he was resting comfortably in bed, however his breathing had become dysrythmic with variable rates, increased shallow quality, and more audible secretions. His finger tips are now cold and purple, with the mottling extending up his fingers towards his palm. His lips and the surrounding skin is now also purple mottled. I checked an O2 saturation and found he was at 48%. This information, both the O2 saturation and my physical assessment, I shared with Malik Webb. I also let her know that these were signs that his body was in a transitional period, and he likely only had hours to a day left. She has called his sister multiple times, and I have called and left a message. Neither of [place] have been able to get a hold of her.   Recommendations/Plan:  DNR, comfort care  Pt with marked changes since earlier today, he is imminently approaching death, which I would expect in hours to a day. This will be a hospital death at this point. Primary team notified  Symptoms:    -Dyspnea/respiratory strain:   -Scheduled morphine SL (increased to 86m q4H; will be held if he is asleep)  -PRN Dilaudid IV, now with dosage range and q2H PRN -Secretions:  - Start scopolamine patch, continue scheduled Robinul -Anxiety:   -PRN Haldol IV -Insomnia  -Stop Trazodone, family refusing pills (even crushed), start Zyprexa ODT 532mqHS  **Of note, I am hesitant to start a drip. He becomes very frustrated by being connected to the IV and has a high potential for pulling it out/removing it. Will utilize the SL medication as able, with a drip being the last resort.   Goals of Care and Additional Recommendations:  Limitations on Scope of Treatment: Full Comfort Care  Code Status:  DNR  Prognosis:   Hours - Days  Discharge Planning:  Anticipated Hospital Death   Care plan was discussed with pt's caregiver, care nurse, primary team notified.  Thank you for allowing the Palliative Medicine Team to assist in the care of this patient.  Time in/out: 0830/0845; 1000/1015; 1300/1330  Total time: 75 minutes    Greater than 50%  of this time was spent counseling and coordinating care related to the above assessment and plan.  SaCharlynn CourtNP Palliative Medicine Team [phone] (7a-5p) Team Phone # [phone]

## 2017-03-20 NOTE — Progress Notes (Signed)
[facility]                                                                              Patient Demographics  Malik Webb, is a 46 y.o. male, DOB - 1971-10-08, ZOX:[id number]  Admit date - 03/16/2017   Admitting Physician Hillary Bow, DO  Outpatient Primary MD for the patient is [name], MD  Outpatient specialists:   LOS - 3  days   Medical records reviewed and are as summarized below:    Chief Complaint  Patient presents with  . Shortness of Breath       Brief summary   46 year old male with history of spastic quadriplegic cerebral palsy mostly bed to wheelchair bound-and dependent on caregivers for 24/7 care including all activities of daily living-on a pure diet at home, admitted with several days history of cough, shortness of breath, thought to be due to aspiration pneumonia. He was admitted and started on empiric antimicrobial therapy-unfortunately continues to have overt aspiration in spite of maximal therapy and speech therapy evaluation. Dr Jerral Ralph had extensive discussion with family, he has now been transitioned to comfort measures.     Assessment & Plan    Principal Problem:   Right upper lobe pneumonia (HCC), Aspiration pneumonia - History of cerebral palsy, dysphagia, patient continued to overtly aspirate in spite of antimicrobial therapy, chest physiotherapy and mucolytic's. - Dr Jerral Ralph discussed extensively with the family who understand the risks, patient was transitioned to mostly comfort measures as there was no significant improvement and patient continued to worsen through the hospital stay. - Antibiotics, the medications were discontinued, palliative care was consulted for optimizing this symptom control for transitioning patient to hospice.  Active Problems:   CP (cerebral palsy) (HCC) - History of spastic quadriplegic cerebral palsy, at baseline patient is bed to wheelchair bound on pure diet - Has a caregiver for  all his ADLs overall poor prognosis and currently comfort measures    Pressure injury of skin -Stage II sacral decub, appreciate wound care evaluation  History of GERD/Barrett's esophagus - No PPI or Carafate, currently comfort measures  History of chronic systolic heart failure - Unknown EF, currently comfort measures     Code Status: DNR  DVT Prophylaxis: Comfort measures  Family Communication: Discussed in detail with the patient, all imaging results, lab results explained to the patient's caregiver  Disposition Plan: Not doing too well,  Time Spent in minutes  25 minutes  Procedures:  None   Consultants:   Palliative medicine  Antimicrobials:   Unasyn  6/4-6/5  Vancomycin 6/2- 6/4  Aztreonam 6/2- 6/4   Medications  Scheduled Meds: . baclofen  20 mg Oral TID  . gabapentin  300 mg Oral TID  . glycopyrrolate  0.2 mg Intravenous TID  . morphine CONCENTRATE  20 mg Sublingual Q4H  . OLANZapine zydis  5 mg Oral QHS  . pantoprazole sodium  40 mg Oral Daily  . scopolamine  1 patch Transdermal Q72H   Continuous Infusions: . sodium chloride 10 mL/hr at 03/19/17 0602   PRN Meds:.antiseptic oral rinse, haloperidol lactate, HYDROmorphone (DILAUDID) injection, ondansetron **OR** [DISCONTINUED] ondansetron (ZOFRAN)  IV, polyvinyl alcohol, RESOURCE THICKENUP CLEAR   Antibiotics   Anti-infectives    Start     Dose/Rate Route Frequency Ordered Stop   03/18/17 0845  ampicillin-sulbactam (UNASYN) 1.5 g in sodium chloride 0.9 % 50 mL IVPB  Status:  Discontinued     1.5 g 100 mL/hr over 30 Minutes Intravenous Every 8 hours 03/18/17 0832 03/19/17 0912   03/17/17 2200  vancomycin (VANCOCIN) 500 mg in sodium chloride 0.9 % 100 mL IVPB  Status:  Discontinued     500 mg 100 mL/hr over 60 Minutes Intravenous Every 24 hours 03/17/17 0158 03/18/17 0824   03/17/17 0600  aztreonam (AZACTAM) 1 g in dextrose 5 % 50 mL IVPB  Status:  Discontinued     1 g 100 mL/hr over 30 Minutes  Intravenous Every 8 hours 03/17/17 0158 03/18/17 0824   03/16/17 2100  aztreonam (AZACTAM) 2 g in dextrose 5 % 50 mL IVPB     2 g 100 mL/hr over 30 Minutes Intravenous  Once 03/16/17 2049 03/16/17 2301   03/16/17 2100  vancomycin (VANCOCIN) IVPB 1000 mg/200 mL premix     1,000 mg 200 mL/hr over 60 Minutes Intravenous  Once 03/16/17 2049 03/17/17 0003        Subjective:   Malik Webb was seen and examined today.  Unable to get any review of system from the patient, due to his cerebral palsy. Caregiver at the bedside. Still having significant secretions, not doing too well.  Objective:   Vitals:   03/19/17 1402 03/19/17 2125 03/20/17 0552 03/20/17 1323  BP: (!) 144/91 (!) 141/75 135/88   Pulse: (!) 108 82 92   Resp: (!) 21 18 18    Temp: 98.8 F (37.1 C) 98.8 F (37.1 C) 98.4 F (36.9 C)   TempSrc: Oral Oral Oral   SpO2: 97% 99% 96% (!) 48%  Weight:      Height:        Intake/Output Summary (Last 24 hours) at 03/20/17 1434 Last data filed at 03/20/17 [id number]  Gross per 24 hour  Intake                0 ml  Output              220 ml  Net             -220 ml     Wt Readings from Last 3 Encounters:  03/16/17 29.9 kg (66 lb)  06/09/16 40.8 kg (90 lb)  01/28/16 36.3 kg (80 lb)     Exam  General:Awake, caregiver at the bedside, very frail with muscle atrophy, spastic   Eyes: PERRLA, EOMI, Anicteric Sclera,  HEENT:  Atraumatic, normocephalic, normal oropharynx  Cardiovascular: S1 S2 auscultated, no rubs, murmurs or gallops. Regular rate and rhythm.  Respiratory:Diffuse rhonchi bilaterally   Gastrointestinal: Soft, nondistended, + bowel sounds  Ext: no pedal edema bilaterally  Neuro:does not follow any commands   Musculoskeletal:Contractures, quadriparesis   Skin: No rashes  Psych:awake  Data Reviewed:  I have personally reviewed following labs and imaging studies  Micro Results Recent Results (from the past 240 hour(s))  Culture, blood (Routine X 2) w  Reflex to ID Panel     Status: None (Preliminary result)   Collection Time: 03/16/17  6:01 PM  Result Value Ref Range Status   Specimen Description BLOOD BLOOD RIGHT FOREARM  Final   Special Requests   Final    BOTTLES DRAWN AEROBIC AND ANAEROBIC Blood Culture adequate volume  Culture NO GROWTH 4 DAYS  Final   Report Status PENDING  Incomplete    Radiology Reports Dg Chest 2 View  Result Date: 03/16/2017 CLINICAL DATA:  Shortness of breath.  Cerebral palsy. EXAM: CHEST  2 VIEW COMPARISON:  01/28/2016 FINDINGS: Technically suboptimal exam due to patient's physical disability and atypical positioning. Asymmetric opacity is seen in the right upper lobe, suspicious for pneumonia. Left lung remains clear. No evidence of pneumothorax or pleural effusion. Gaseous distention of colon noted. IMPRESSION: Technically suboptimal exam. Asymmetric right upper lobe opacity, suspicious for pneumonia. Recommend clinical correlation, and continued chest radiographic follow-up to confirm resolution. Electronically Signed   By: Myles Rosenthal M.D.   On: 03/16/2017 20:38    Lab Data:  CBC:  Recent Labs Lab 03/16/17 1801 03/18/17 0529  WBC 6.7 4.8  NEUTROABS 5.6  --   HGB 13.1 13.2  HCT 40.9 41.5  MCV 95.1 95.0  PLT 201 187   Basic Metabolic Panel:  Recent Labs Lab 03/16/17 1801 03/18/17 0529  NA 139 142  K 3.3* 3.6  CL 107 106  CO2 25 26  GLUCOSE 107* 74  BUN 11 10  CREATININE 0.35* 0.30*  CALCIUM 9.1 9.1   GFR: Estimated Creatinine Clearance: 48.8 mL/min (A) (by C-G formula based on SCr of 0.3 mg/dL (L)). Liver Function Tests:  Recent Labs Lab 03/16/17 1801  AST 27  ALT 28  ALKPHOS 100  BILITOT 0.6  PROT 6.1*  ALBUMIN 3.2*   No results for input(s): LIPASE, AMYLASE in the last 168 hours. No results for input(s): AMMONIA in the last 168 hours. Coagulation Profile: No results for input(s): INR, PROTIME in the last 168 hours. Cardiac Enzymes: No results for input(s): CKTOTAL,  CKMB, CKMBINDEX, TROPONINI in the last 168 hours. BNP (last 3 results) No results for input(s): PROBNP in the last 8760 hours. HbA1C: No results for input(s): HGBA1C in the last 72 hours. CBG: No results for input(s): GLUCAP in the last 168 hours. Lipid Profile: No results for input(s): CHOL, HDL, LDLCALC, TRIG, CHOLHDL, LDLDIRECT in the last 72 hours. Thyroid Function Tests: No results for input(s): TSH, T4TOTAL, FREET4, T3FREE, THYROIDAB in the last 72 hours. Anemia Panel: No results for input(s): VITAMINB12, FOLATE, FERRITIN, TIBC, IRON, RETICCTPCT in the last 72 hours. Urine analysis:    Component Value Date/Time   COLORURINE YELLOW 01/28/2016 1036   APPEARANCEUR CLEAR 01/28/2016 1036   LABSPEC 1.020 01/28/2016 1036   PHURINE >9.0 (H) 01/28/2016 1036   GLUCOSEU NEGATIVE 01/28/2016 1036   HGBUR NEGATIVE 01/28/2016 1036   BILIRUBINUR NEGATIVE 01/28/2016 1036   KETONESUR NEGATIVE 01/28/2016 1036   PROTEINUR NEGATIVE 01/28/2016 1036   NITRITE NEGATIVE 01/28/2016 1036   LEUKOCYTESUR NEGATIVE 01/28/2016 1036     Samreet Edenfield M.D. [facility] 03/20/2017, 2:34 PM  Pager: [phone] Between 7am to 7pm - call Pager - [phone]  After 7pm go to [email or web address] - password [email or web address]  Call night coverage person covering after 7pm

## 2017-03-20 NOTE — Progress Notes (Addendum)
[facility] RN  [facility], patient's medicaid benefits should not be effected by electing hospice benefit, [facility] [place], but this is the information we have at this time. Relayed the information to [place]Angela, [facility].   Thank you,  Adele BarthelAmy Evans, RN, BSN [facility] Liaison [phone]  UPDATE:  Spoke with Murrell ConverseSarah Dunn, NP, PMT - regarding same.

## 2017-03-20 NOTE — Progress Notes (Signed)
Pt's caregiver called this RN at 1238 and asked me to come into room. Caregiver was at bedside crying and stated that Pt looked blue to her and was less responsive. Upon assessing Pt, skin appeared gray and lips were blue. Rhonchi was audible but no secretions in Pt's mouth. Palliative RN was notified of situation and she came up to see Pt. Pt's O2 sats were in 40s. Orders for IV Robinul 0.2mg  were placed and given to Pt to help with secretions. Emotional support given to caregiver at bedside. Will continue to monitor.

## 2017-03-20 NOTE — Progress Notes (Signed)
[facility]  RN  Received call from Murrell ConverseSarah Dunn, NP - PMT.  She advised that patient is now an anticipated hospital death.   I will continue to follow patient and will hold referral per Sarah at this time.  Thank you for the updates.  Adele BarthelAmy Evans, RN, BSN [facility] Liaison [phone]  All hospital liaisons are now on AMION.

## 2017-03-21 DIAGNOSIS — R0902 Hypoxemia: Secondary | ICD-10-CM

## 2017-03-21 LAB — CULTURE, BLOOD (ROUTINE X 2)
CULTURE: NO GROWTH
SPECIAL REQUESTS: ADEQUATE

## 2017-03-21 MED ORDER — GABAPENTIN 250 MG/5ML PO SOLN: 300.0000 mg | Freq: Three times a day (TID) | ORAL | 12 refills | Status: AC

## 2017-03-21 MED ORDER — GLYCOPYRROLATE 0.2 MG/ML IJ SOLN: 0.2000 mg | Freq: Three times a day (TID) | INTRAMUSCULAR | 0 refills | Status: AC

## 2017-03-21 MED ORDER — POLYVINYL ALCOHOL 1.4 % OP SOLN: 1.0000 [drp] | Freq: Four times a day (QID) | OPHTHALMIC | 0 refills | Status: AC | PRN

## 2017-03-21 MED ORDER — BACLOFEN 1 MG/ML ORAL SUSPENSION: 20.0000 mg | Freq: Three times a day (TID) | ORAL | Status: AC

## 2017-03-21 MED ORDER — RESOURCE THICKENUP CLEAR PO POWD: ORAL | Status: AC

## 2017-03-21 MED ORDER — SCOPOLAMINE 1 MG/3DAYS TD PT72
1.0000 | MEDICATED_PATCH | TRANSDERMAL | 0 refills | Status: AC
Start: 2017-03-23 — End: ?

## 2017-03-21 MED ORDER — MORPHINE SULFATE (CONCENTRATE) 10 MG/0.5ML PO SOLN: 20.0000 mg | ORAL | 0 refills | Status: AC

## 2017-03-21 MED ORDER — PANTOPRAZOLE SODIUM 40 MG IV SOLR
40.0000 mg | INTRAVENOUS | Status: DC
  Administered 2017-03-21: 40 mg via INTRAVENOUS
  Filled 2017-03-21: qty 40

## 2017-03-21 MED ORDER — OLANZAPINE 5 MG PO TBDP: 5.0000 mg | ORAL_TABLET | Freq: Every day | ORAL | 0 refills | Status: AC

## 2017-03-21 NOTE — Progress Notes (Signed)
[facility] IV d/c'd. Skin intact except as charted in most recent assessments. Vitals are stable. Report called to receiving facility. Pt to be transported by ambulance service.

## 2017-03-21 NOTE — Progress Notes (Signed)
Daily Progress Note   Patient Name: Briant Angelillo       Date: 03/21/2017 DOB: 24-Feb-1971  Age: 46 y.o. MRN#: [id number] Attending Physician: Cathren Harsh, MD Primary Care Physician: [name], MD Admit Date: 03/16/2017  Reason for Consultation/Follow-up: Disposition, Non pain symptom management, Psychosocial/spiritual support and Terminal Care  Subjective: Mr. Crisp is not able to verbally communicate his needs. He is more alert today and much calmer in bed. No behavioral signs of distress. His sister is at his bedside and report that he rested well overnight with no episodes of acute agitation or distress.   Length of Stay: 4  Current Medications: Scheduled Meds:  . baclofen  20 mg Oral TID  . gabapentin  300 mg Oral TID  . glycopyrrolate  0.2 mg Intravenous TID  . morphine CONCENTRATE  20 mg Sublingual Q4H  . OLANZapine zydis  5 mg Oral QHS  . pantoprazole sodium  40 mg Oral Daily  . scopolamine  1 patch Transdermal Q72H    Continuous Infusions: . sodium chloride 10 mL/hr at 03/19/17 0602    PRN Meds: antiseptic oral rinse, haloperidol lactate, HYDROmorphone (DILAUDID) injection, ondansetron **OR** [DISCONTINUED] ondansetron (ZOFRAN) IV, polyvinyl alcohol, RESOURCE THICKENUP CLEAR  Physical Exam  Constitutional: No distress.  HENT:  Head: Macrocephalic.  Mouth/Throat: Mucous membranes are pale. Abnormal dentition.  Less audible secretions  Eyes: EOM are normal.  Neck: Normal range of motion. Neck supple.  Cardiovascular: Regular rhythm.  Tachycardia present.   Pulmonary/Chest: Tachypnea noted. He has decreased breath sounds in the right lower field and the left lower field. He has rhonchi.  Shallow breaths with poor air movement. Rhonchi remains in upper airways  Abdominal: Soft. Bowel sounds are normal.    Musculoskeletal:  Significant contractures and muscle wasting. Muscles seems more relaxed today compared to yesterday; less discomfort with repositioning  Neurological: He is alert.  Responsive to voice, but not verbally communicative  Skin: Skin is warm and dry. There is pallor.  Hands still with some purple mottling, but improved compared to yesterday.   Psychiatric:  Calm in bed.           Vital Signs: BP 120/72 (BP Location: Left Arm)   Pulse (!) 103   Temp 98 F (36.7 C) (Oral)   Resp 18   Ht 4\' 9"  (1.448 m)   Wt 29.9 kg (66 lb)   SpO2 (!) 89%   BMI 14.28 kg/m  SpO2: SpO2: (!) 89 % O2 Device: O2 Device: Not Delivered O2 Flow Rate:    Intake/output summary:   Intake/Output Summary (Last 24 hours) at 03/21/17 0839 Last data filed at 03/21/17 0631  Gross per 24 hour  Intake                0 ml  Output                0 ml  Net                0 ml   LBM: Last BM Date: 03/19/17 Baseline Weight: Weight: 29.9 kg (66 lb) Most recent weight: Weight: 29.9 kg (66 lb)  Palliative Assessment/Data: PPS 10%   Flowsheet Rows  Most Recent Value  Intake Tab  Referral Department  Hospitalist  Unit at Time of Referral  Med/Surg Unit  Palliative Care Primary Diagnosis  Sepsis/Infectious Disease  Date Notified  03/17/17  Palliative Care Type  New Palliative care  Reason for referral  Clarify Goals of Care  Date of Admission  03/16/17  Date first seen by Palliative Care  03/17/17  # of days Palliative referral response time  0 Day(s)  # of days IP prior to Palliative referral  1  Clinical Assessment  Palliative Performance Scale Score  20%  Pain Max last 24 hours  Not able to report  Pain Min Last 24 hours  Not able to report  Dyspnea Max Last 24 Hours  Not able to report  Dyspnea Min Last 24 hours  Not able to report  Nausea Max Last 24 Hours  Not able to report  Nausea Min Last 24 Hours  Not able to report  Anxiety Max Last 24 Hours  Not able to report  Anxiety Min  Last 24 Hours  Not able to report  Other Max Last 24 Hours  Not able to report  Psychosocial & Spiritual Assessment  Palliative Care Outcomes  Patient/Family meeting held?  Yes  Who was at the meeting?  caregiver, legal guardian  Palliative Care Outcomes  Clarified goals of care, Provided psychosocial or spiritual support, Changed CPR status, Completed durable DNR, Provided advance care planning  Patient/Family wishes: Interventions discontinued/not started   Mechanical Ventilation, Hemodialysis, Vasopressors, Trach, Tube feedings/TPN, PEG  Palliative Care follow-up planned  No      Patient Active Problem List   Diagnosis Date Noted  . Pressure injury of skin 03/20/2017  . Terminal care   . Aspiration into airway   . Palliative care encounter   . Palliative care by specialist   . CP (cerebral palsy) (HCC) 03/16/2017  . Right upper lobe pneumonia (HCC) 03/16/2017  . Closed nondisplaced supracondylar fracture of lower end of left femur without intracondylar extension with routine healing 12/02/2016    Palliative Care Assessment & Plan   [name]  with past medical history of Cerebral palsy, spastic quadriplegic, stage II sacral decubitus, GERD/Barrett's esophagitis, MI admitted on 03/16/2017 with increased cough, shortness of breath. Patient was found to have a right upper lobe infiltrate. [facility] of [facility]'s care [facility] at [facility]. Patient resides in a group home, but primarily lives with his caregiver, Alvina ChouCeAerra White. His sister, Loetta Roughngela Boies is his legal guardian.   Assessment: Mr. Montez MoritaCarter has been slowly declining since admission. Yesterday afternoon he appeared to be close to death. His O2 saturation was 48% for multiple hours, his hands were cold and mottled, and lips and surrounding skin was mottled. Overnight, however, his respiratory status improved and this morning his color has improved with mottling gone around his lips, improved on his hands, and now his hands  are warm. I suspect he is having periods of mucous plugging, but was able to clear it at some point over night. Unfortunately, despite improvement in his vitals and clinical exam, he is no longer eating or drinking and, I suspect, will continue to have episodes of mucous plugging. I shared all of this information with the family and reiterated that it was unknown which episode would result in his death.   In terms of ongoing care, I do believe there is now a window for transitioning him out of the hospital. I do not think he should go home, as  the acute onset of his symptoms (namely agitation and respiratory distress) have required IV medication to manage. He can not be on a continuous drip due to the significant distress it causes him to be hooked up to a continuous IV, and I do not think SQ would be adequate for fast symptom relief (and he has almost no body fat for ease of administration). Consequently, I shared that he could be moved to a residential hospice house. His sister is talking with Zack Seal about this option today.   Recommendations/Plan:  DNR, comfort care  Sister and caregiver discussing location options, I did emphasize that his window for moving out of the hospital is likely short  Symptoms:  -Dyspnea/respiratory strain:   -Scheduled morphine SL (increased to 20mg  q4H; will be held if he is asleep)  -PRN Dilaudid IV, now with dosage range and q2H PRN -Secretions:  - Scopolamine patch, continue scheduled Robinul -Anxiety:   -PRN Haldol IV -Insomnia  -Zyprexa ODT 5mg  qHS -Muscle contractures  -Baclofen changed to liquid form, would allow use if pt awake. Despite oral administration, volume of medication is very minimal, and the benefit it provides is substantial. If he starts coughing with it, will d/c  **Of note, I am hesitant to start a drip. He becomes very frustrated by being connected to the IV and has a high potential for pulling it out/removing it. Will utilize the SL  medication as able, with a drip being the last resort.   Goals of Care and Additional Recommendations:  Limitations on Scope of Treatment: Full Comfort Care  Code Status:  DNR  Prognosis:   Hours - Days  Discharge Planning:  To Be Determined. Family discussing options. They may want him to remain here given his short prognosis, however they are considering residential hospice as well. I do not believe going home is a safe option at this point.   Care plan was discussed with pt's sister, care nurse.  Thank you for allowing the Palliative Medicine Team to assist in the care of this patient.  Time in/out: 0830/0900  Total time: 35 minutes    Greater than 50%  of this time was spent counseling and coordinating care related to the above assessment and plan.  Murrell Converse, NP Palliative Medicine Team [phone] pager (7a-5p) Team Phone # [phone]

## 2017-03-21 NOTE — Progress Notes (Signed)
[facility] Hospital Liaison:  RN visit Received request from [place], [place], for family interest in [facility] with request for transfer today.  Chart reviewed.  Met with patient and family to confirm interest and explain services.  Family is agreeable to transfer today.  Percell Locus, LCSW, aware.  Registration paperwork completed at 1105am today.  Dr. Orpah Melter to assume care per family request.  Please fax discharge summary to [phone].  RN, please call report to [phone]. Please arrange transport for patient to arrive as soon as possible.    Thank you for the referral,  Edyth Gunnels, RN, BSN [facility] Liaison [phone]   All hospital liaisons are now on [place].

## 2017-03-21 NOTE — Progress Notes (Signed)
Patient will DC to: [facility] Anticipated DC date: 03/21/17 Family notified: Sister Transport by: Sharin MonsPTAR   Per MD patient ready for DC to Hospice. RN, patient, patient's family, and facility notified of DC. Discharge Summary sent to facility. RN given number for report [phone]). DC packet on chart. Ambulance transport requested for patient.   CSW signing off.  Cristobal GoldmannNadia Jaquavius Hudler, [place] Clinical Social Worker [phone]

## 2017-03-21 NOTE — Progress Notes (Signed)
Nutrition Brief Note  Chart reviewed. Pt now transitioning to comfort care.  No further nutrition interventions warranted at this time.  Please re-consult as needed.   Olga Seyler A. Que Meneely, RD, LDN, CDE Pager: [phone] After hours Pager: [phone]  

## 2017-03-21 NOTE — Clinical Social Work Note (Signed)
Clinical Social Work Assessment  Patient Details  Name: Malik Webb MRN: [id number] Date of Birth: 1971/09/19  Date of referral:  03/21/17               Reason for consult:  End of Life/Hospice                Permission sought to share information with:  Facility Medical sales representativeContact Representative, Family Supports Permission granted to share information::  No  Name::        Agency::  Hospice  Relationship::  Sister  Contact Information:     Housing/Transportation Living arrangements for the past 2 months:  Group Home Source of Information:  Guardian Patient Interpreter Needed:  None Criminal Activity/Legal Involvement Pertinent to Current Situation/Hospitalization:  No - Comment as needed Significant Relationships:  Siblings, Other(Comment) (Caregiver) Lives with:  Other (Comment) Do you feel safe going back to the place where you live?  No Need for family participation in patient care:  Yes (Comment)  Care giving concerns:  CSW received consult for possible hospice placement. Patient is not oriented. CSW spoke with patient's sister and caregiver at bedside. They would like for [facility] to go to [facility]. CSW to continue to follow and assist with discharge planning needs.   Social Worker assessment / plan:  CSW spoke with patient's sister concerning possibility of residential hospice.  Employment status:  Disabled (Comment on whether or not currently receiving Disability) Insurance information:  [place], [place] PT Recommendations:  No Follow Up, Not assessed at this time Information / Referral to community resources:  Other (Comment Required) (Hospice)  Patient/Family's Response to care:  Patient's sister recognizes need for hospice placement and is agree[place]. Patient's sister reported preference [facility].  Patient/Family's Understanding of and Emotional Response to Diagnosis, Current Treatment, and Prognosis:  Patient/family is  realistic regarding therapy needs and expressed being hopeful for hospice placement. Patient's sister expressed understanding of CSW role and discharge process. She expressed sadness but is grateful for the time with the patient. No questions/concerns about plan or treatment.    Emotional Assessment Appearance:  Appears stated age Attitude/Demeanor/Rapport:  Unable to Assess Affect (typically observed):  Unable to Assess Orientation:   (Disoriented) Alcohol / Substance use:  Not Applicable Psych involvement (Current and /or in the community):  No (Comment)  Discharge Needs  Concerns to be addressed:  Discharge Planning Concerns Readmission within the last 30 days:  No Current discharge risk:  None Barriers to Discharge:  No Barriers Identified   Mearl Latinadia S Oceanna Arruda, LCSWA 03/21/2017, 11:08 AM

## 2017-03-21 NOTE — Care Management Important Message (Signed)
Important Message  Patient Details  Name: Malik Webb MRN: [id number] Date of Birth: [birth year]   Medicare Important Message Given:  Yes    Malik Webb 03/21/2017, 12:12 PM

## 2017-03-21 NOTE — Discharge Summary (Signed)
Physician Discharge Summary   Patient ID: Malik Webb MRN: [id number] DOB/AGE: 1971-08-21 46 y.o.  Admit date: 03/16/2017 Discharge date: 03/21/2017  Primary Care Physician:  [name], MD  Discharge Diagnoses:    . CP (cerebral palsy) [facility]), spastic quadriplegic . Right upper lobe pneumonia (HCC)Aspiration pneumonia Stage II sacral decub History of GERD/Barrett's esophagus History of chronic systolic heart failure Comfort measures only  Consults:  Palliative medicine  Recommendations for Outpatient Follow-up:  1. DNR/DNI, comfort measures only 2. Comfort feeds, dysphagia one nectar thick diet family understands aspiration risk with all consistencies    Allergies:   Allergies  Allergen Reactions  . Sulfa Antibiotics Diarrhea    (Already present in Epic, but no allergies/intolerances were noted on MAR)  . Cefazolin Rash    (Already present in Epic, but no allergies were noted on MAR)  . Erythromycin Rash    (Already present in Epic, but no allergies were noted on MAR)  . Ofloxacin Rash    Possible rash, but can tolerate Avelox (Already present in Epic, but no allergies were noted on MAR)     DISCHARGE MEDICATIONS: Current Discharge Medication List    START taking these medications   Details  baclofen (LIORESAL) 10 mg/mL SUSP Take 2 mLs (20 mg total) by mouth 3 (three) times daily.    gabapentin (NEURONTIN) 250 MG/5ML solution Take 6 mLs (300 mg total) by mouth 3 (three) times daily. Refills: 12    glycopyrrolate (ROBINUL) 0.2 MG/ML injection Inject 1 mL (0.2 mg total) into the vein 3 (three) times daily. Qty: 30 mL, Refills: 0    Maltodextrin-Xanthan Gum (RESOURCE THICKENUP CLEAR) POWD Nectar thick    Morphine Sulfate (MORPHINE CONCENTRATE) 10 MG/0.5ML SOLN concentrated solution Place 1 mL (20 mg total) under the tongue every 4 (four) hours. Qty: 30 mL, Refills: 0    OLANZapine zydis (ZYPREXA) 5 MG disintegrating tablet Take 1 tablet (5 mg total) by  mouth at bedtime. Qty: 20 tablet, Refills: 0    polyvinyl alcohol (LIQUIFILM TEARS) 1.4 % ophthalmic solution Place 1 drop into both eyes 4 (four) times daily as needed for dry eyes. Qty: 15 mL, Refills: 0    scopolamine (TRANSDERM-SCOP) 1 MG/3DAYS Place 1 patch (1.5 mg total) onto the skin every 3 (three) days. Qty: 5 patch, Refills: 0      CONTINUE these medications which have NOT CHANGED   Details  albuterol (PROVENTIL) (2.5 MG/3ML) 0.083% nebulizer solution Take 3 mLs (2.5 mg total) by nebulization every 6 (six) hours as needed for wheezing or shortness of breath. Qty: 75 mL, Refills: 12    polyethylene glycol (MIRALAX / GLYCOLAX) packet Take 17 g by mouth daily as needed for mild constipation. MIX INTO 4-8 OUNCES OF WATER AND DRINK    gabapentin (NEURONTIN) 300 MG capsule Take by mouth.      STOP taking these medications     acetaminophen (TYLENOL) 500 MG tablet      baclofen (LIORESAL) 20 MG tablet      benzonatate (TESSALON) 100 MG capsule      bisacodyl (DULCOLAX) 10 MG suppository      esomeprazole (NEXIUM) 40 MG capsule      ferrous sulfate 325 (65 FE) MG tablet      FLUoxetine (PROZAC) 40 MG capsule      gabapentin (NEURONTIN) 300 MG capsule      guaiFENesin (HM TUSSIN ADULT) 100 MG/5ML liquid      lactulose (CONSTULOSE) 10 GM/15ML solution  LORazepam (ATIVAN) 0.5 MG tablet      magnesium oxide (MAG-OX) 400 MG tablet      potassium chloride (K-DUR,KLOR-CON) 10 MEQ tablet      sucralfate (CARAFATE) 1 g tablet      traZODone (DESYREL) 150 MG tablet      traZODone (DESYREL) 50 MG tablet      zinc oxide 20 % ointment          Brief H and P: For complete details please refer to admission H and P, but in brief 46 year old male withhistory of spastic quadriplegic cerebral palsy mostly bed to wheelchair bound-and dependent on caregivers for 24/7 care including all activities of daily living-on a pure diet at home, admitted with several days  history of cough, shortness of breath,thought to be due to aspiration pneumonia. He was admitted and started on empiric antimicrobial therapy-unfortunately continues to have overt aspiration in spite of maximal therapy and speech therapy evaluation. Dr Jerral RalphGhimire had extensive discussion with family, he has now been transitioned to comfort measures.   Hospital Course:   Right upper lobe pneumonia (HCC), Aspiration pneumonia - History of cerebral palsy, dysphagia, patient continued to overtly aspirate in spite of antimicrobial therapy, chest physiotherapy and mucolytic's. - Dr Jerral RalphGhimire discussed extensively with the family who understand the risks, patient was transitioned to mostly comfort measures as there was no significant improvement and patient continued to worsen through the hospital stay. - Antibiotics, other medications were discontinued, palliative care was consulted for optimizing this symptom control for transitioning patient to hospice. - I discussed in detail with patient's sister at the bedside, patient had no significant improvement and yesterday had significant respiratory distress with hypoxia, was started on comfort measures and sublingual morphine, Robinul for secretions by palliative medicine. He has been slowly declining since admission. His sister agreed for residential hospice care at this time. [facility].    CP (cerebral palsy) (HCC) - History of spastic quadriplegic cerebral palsy, at baseline patient is bed to wheelchair bound, on pure diet - Has a caregiver for all his ADLs overall poor prognosis and currently comfort measures    Pressure injury of skin -Stage II sacral decub, wound care recommended foam dressing to the sacrum, change every 3 days or when necessary soiling  History of GERD/Barrett's esophagus - No PPI or Carafate, currently comfort measures  History of chronic systolic heart failure - Unknown EF, currently comfort measures    Day of Discharge BP 120/72 (BP Location: Left Arm)   Pulse (!) 103   Temp 98 F (36.7 C) (Oral)   Resp 18   Ht 4\' 9"  (1.448 m)   Wt 29.9 kg (66 lb)   SpO2 (!) 89%   BMI 14.28 kg/m   Physical Exam: General: Alert, awake, frail with muscle atrophy, spastic HEENT: anicteric sclera, pupils reactive to light and accommodation CVS: S1-S2 clear no murmur rubs or gallops Chest: Diffuse rhonchi bilaterally Abdomen: soft nontender, nondistended, normal bowel sounds Extremities: Contractures bilaterally with quadriparesis, muscle atrophy, no pedal edema Neuro: quadriparesis, does not follow commands   The results of significant diagnostics from this hospitalization (including imaging, microbiology, ancillary and laboratory) are listed below for reference.    I have reviewed all the lab results and imaging below  LAB RESULTS: Basic Metabolic Panel:  Recent Labs Lab 03/16/17 1801 03/18/17 0529  NA 139 142  K 3.3* 3.6  CL 107 106  CO2 25 26  GLUCOSE 107* 74  BUN 11 10  CREATININE 0.35* 0.30*  CALCIUM 9.1 9.1   Liver Function Tests:  Recent Labs Lab 03/16/17 1801  AST 27  ALT 28  ALKPHOS 100  BILITOT 0.6  PROT 6.1*  ALBUMIN 3.2*   No results for input(s): LIPASE, AMYLASE in the last 168 hours. No results for input(s): AMMONIA in the last 168 hours. CBC:  Recent Labs Lab 03/16/17 1801 03/18/17 0529  WBC 6.7 4.8  NEUTROABS 5.6  --   HGB 13.1 13.2  HCT 40.9 41.5  MCV 95.1 95.0  PLT 201 187   Cardiac Enzymes: No results for input(s): CKTOTAL, CKMB, CKMBINDEX, TROPONINI in the last 168 hours. BNP: Invalid input(s): POCBNP CBG: No results for input(s): GLUCAP in the last 168 hours.  Significant Diagnostic Studies:  Dg Chest 2 View  Result Date: 03/16/2017 CLINICAL DATA:  Shortness of breath.  Cerebral palsy. EXAM: CHEST  2 VIEW COMPARISON:  01/28/2016 FINDINGS: Technically suboptimal exam due to patient's physical disability and atypical positioning.  Asymmetric opacity is seen in the right upper lobe, suspicious for pneumonia. Left lung remains clear. No evidence of pneumothorax or pleural effusion. Gaseous distention of colon noted. IMPRESSION: Technically suboptimal exam. Asymmetric right upper lobe opacity, suspicious for pneumonia. Recommend clinical correlation, and continued chest radiographic follow-up to confirm resolution. Electronically Signed   By: Myles Rosenthal M.D.   On: 03/16/2017 20:38    2D ECHO:   Disposition and Follow-up:    DISPOSITION: [facility]   DISCHARGE FOLLOW-UP Follow-up Information    [name], MD. Schedule an appointment as soon as possible for a visit.   Specialty:  Internal Medicine Why:  as needed  Contact information: [address] [place] [address] [phone]            Time spent on Discharge:   Signed:   Thad Ranger M.D. [facility] 03/21/2017, 12:22 PM Pager: [phone]

## 2017-04-14 DEATH — deceased

## 2017-10-25 IMAGING — CR DG CHEST 1V
1 series · 1 of 1 positions shown · non-contrast
Comparison: None.

CLINICAL DATA: Cough, congestion in a patient with cerebral palsy.

EXAM:
CHEST 1 VIEW

[chest pa]
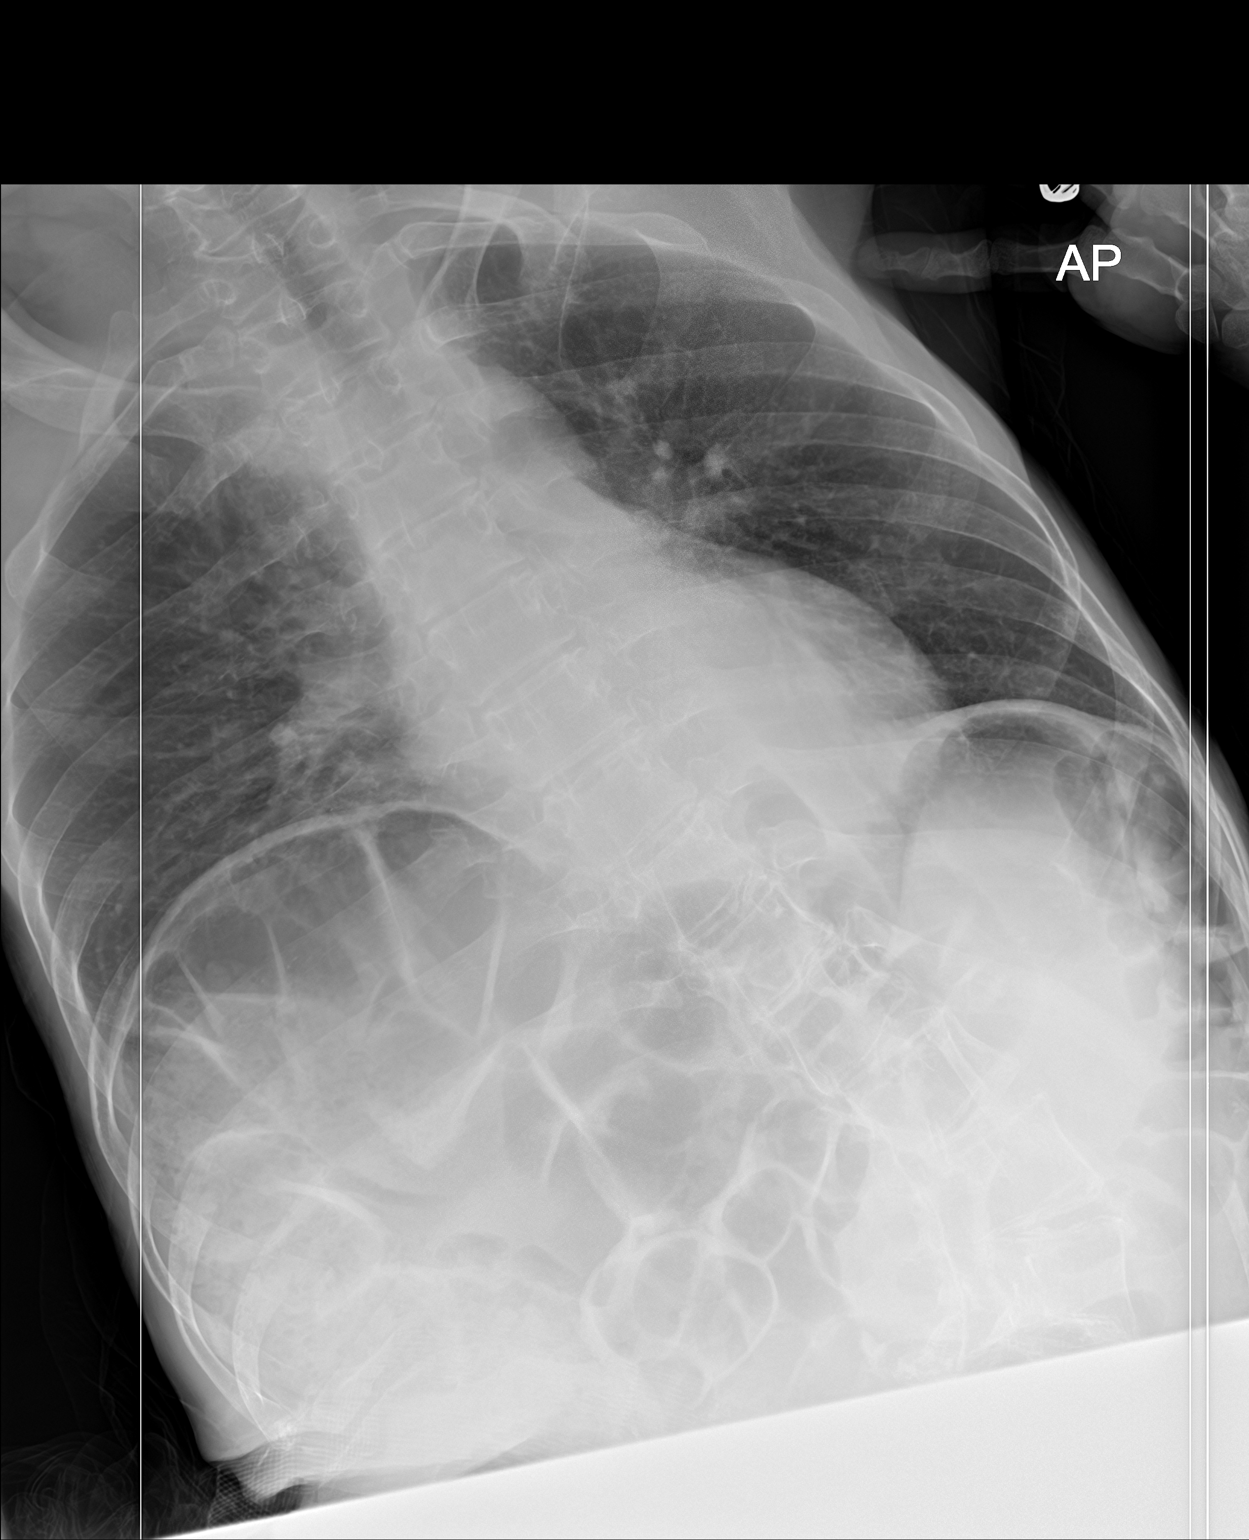

[1 of 1 positions shown; findings below may reference images not displayed]

FINDINGS: Single frontal radiograph demonstrates normal cardiomediastinal
silhouette.

The left lung is clear. There is no evidence of pleural effusion or
pneumothorax.

There is elevation of the right hemidiaphragm, with atelectatic
changes of the right lower lobe.

Gaseous distention of the colon is noted.
IMPRESSION: No evidence of focal airspace consolidation.

Elevation of the right hemidiaphragm with subsequent atelectatic
changes in the right lung base.

Gaseous distention of the colon.

## 2018-08-25 IMAGING — DX DG FEMUR 2+V*R*
5 series · 5 of 5 positions shown · non-contrast
Comparison: None.

CLINICAL DATA: Fall.  History of cerebral palsy.

EXAM:
RIGHT FEMUR 2 VIEWS

[femur ap (1 of 3)]
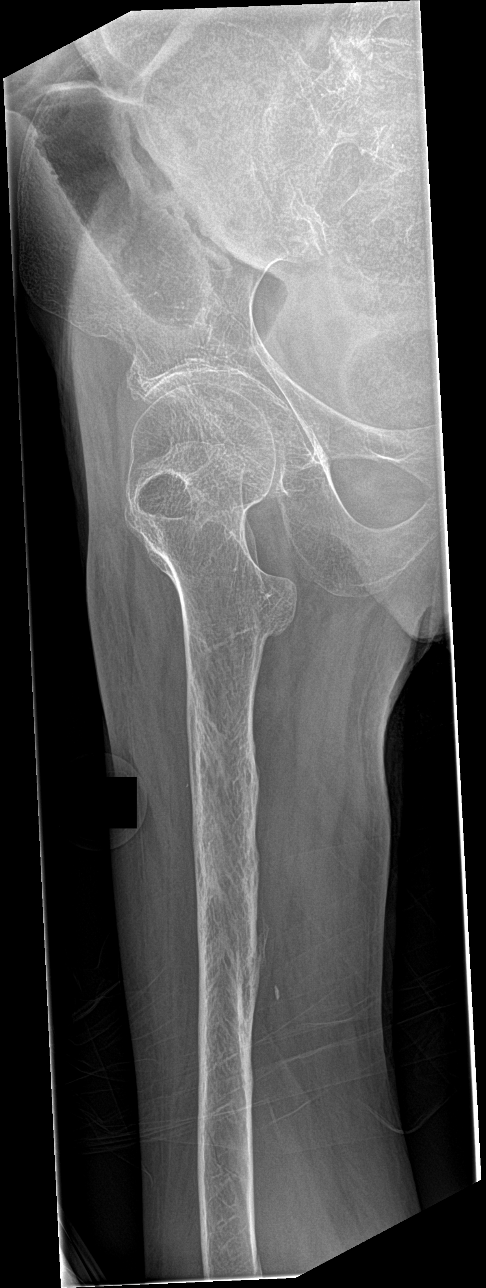

[femur ap (2 of 3)]
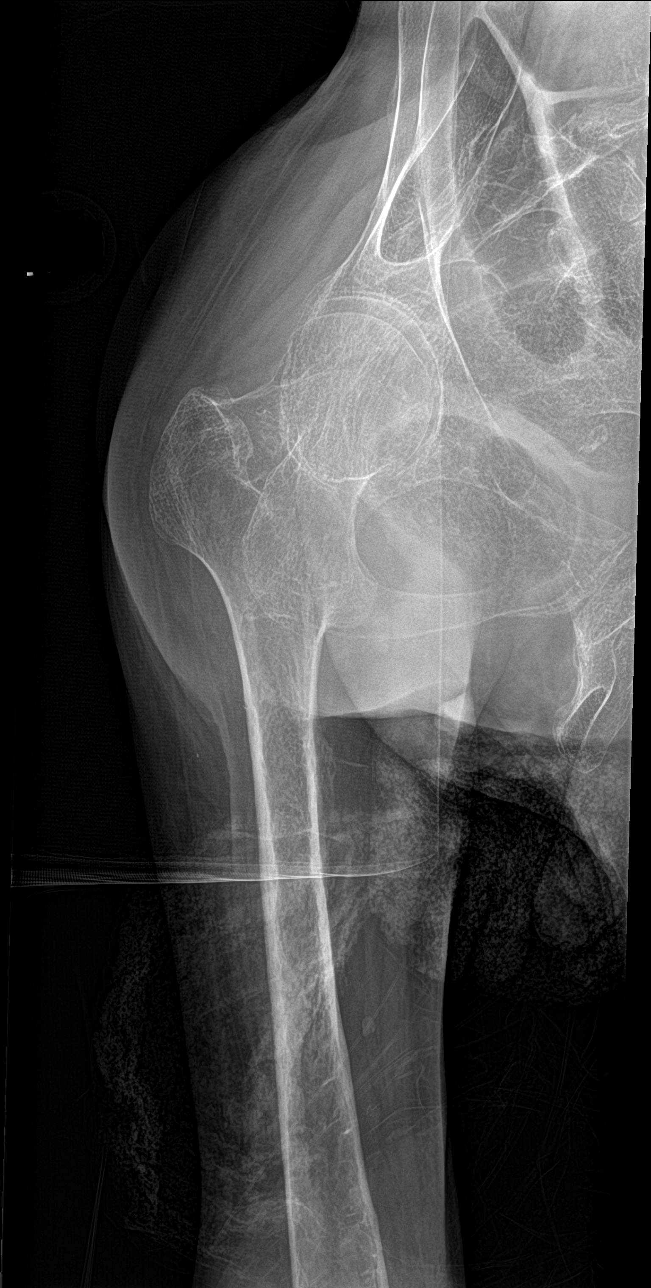

[femur lat (1 of 2)]
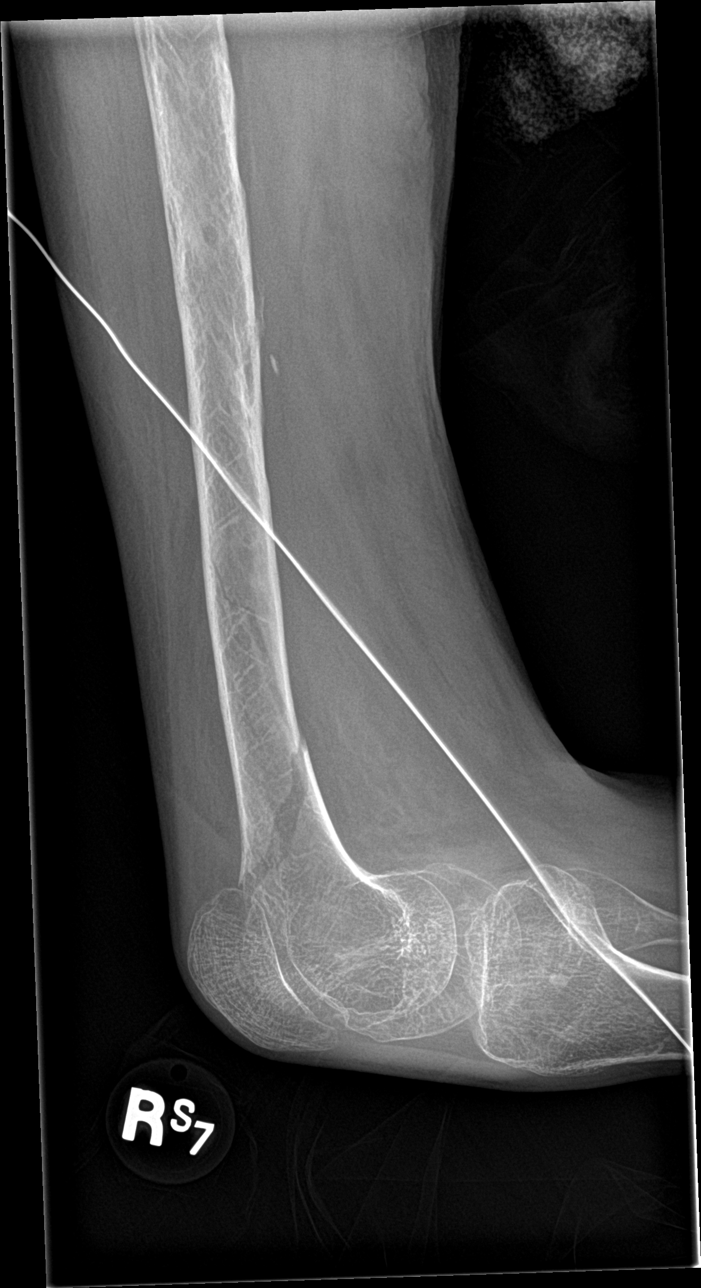

[femur lat (2 of 2)]
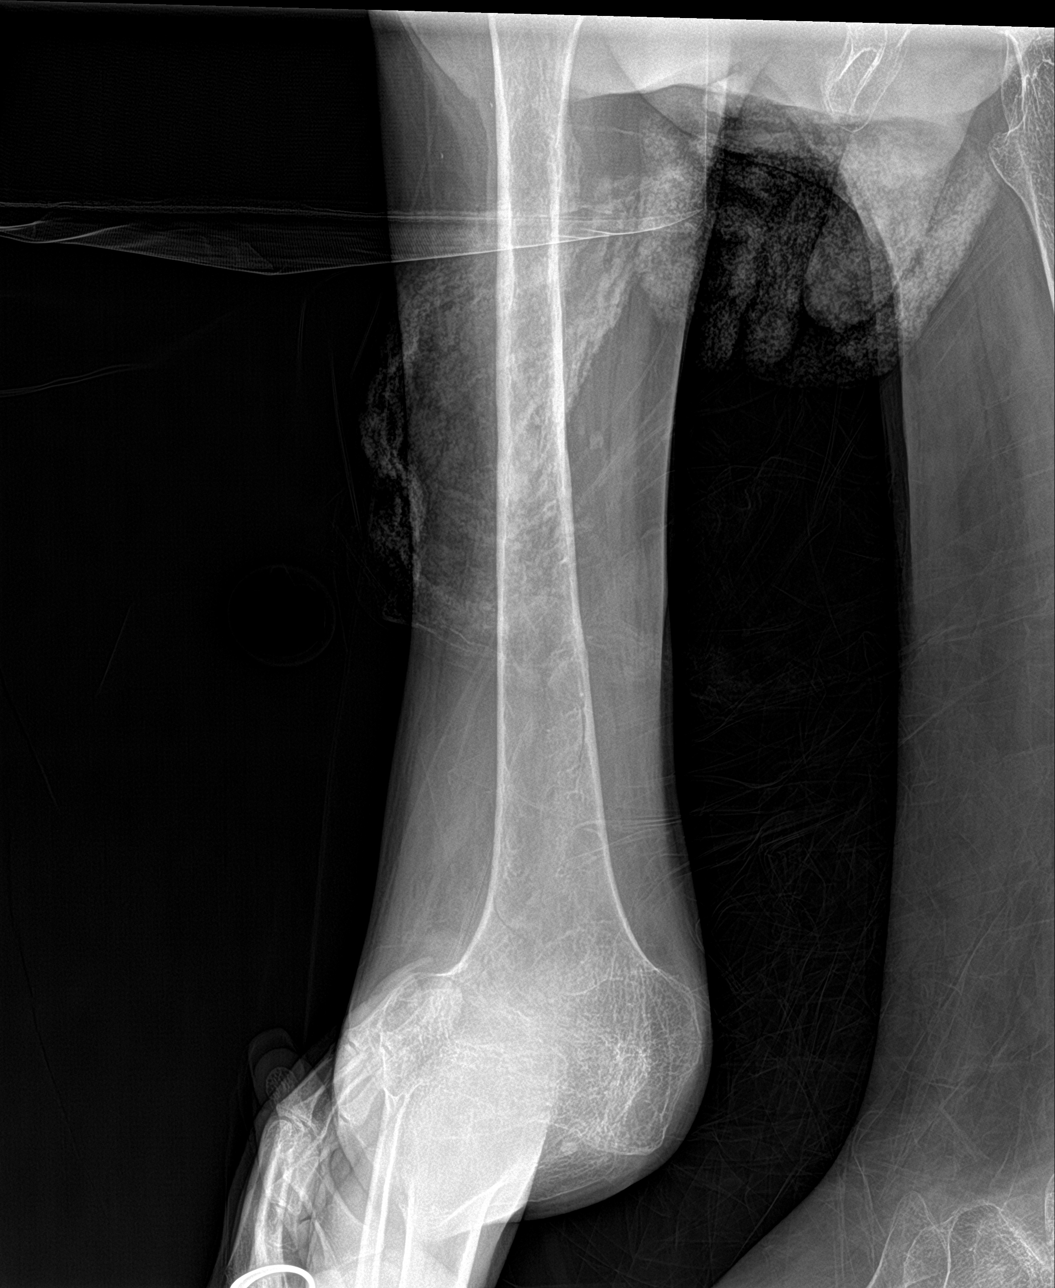

[femur ap (3 of 3)]
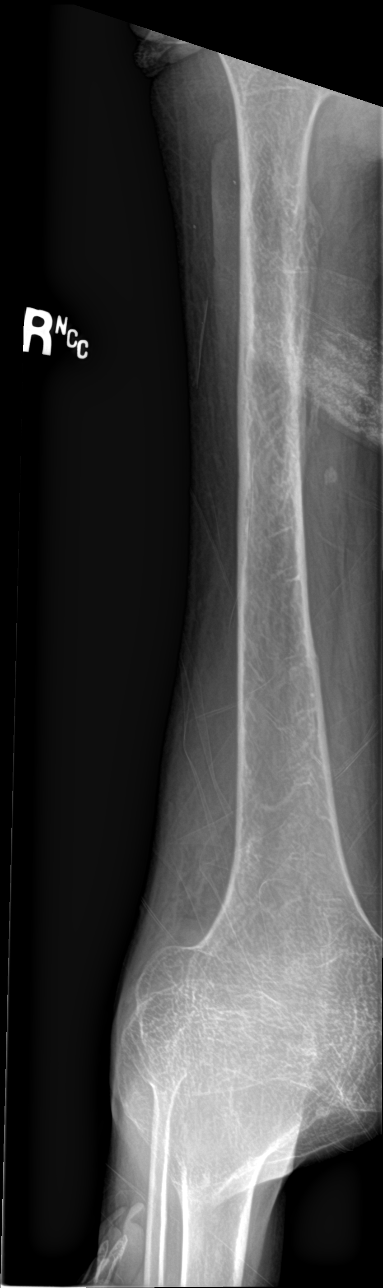

[5 of 5 positions shown; findings below may reference images not displayed]

FINDINGS: The bones appear osteopenic. No dislocations identified.
Nondisplaced, oblique fracture involves the distal shaft of the
femur is identified.
IMPRESSION: 1. Osteopenia.
2. Acute fracture involves the distal shaft of the right femur.
